# Patient Record
Sex: Female | Born: 1970 | Race: White | Hispanic: No | Marital: Married | Smoking: Never smoker
Health system: Southern US, Community
[De-identification: ages and names within clinical notes are randomized; demographics above are authoritative.]

## PROBLEM LIST (undated history)

## (undated) DIAGNOSIS — N840 Polyp of corpus uteri: Secondary | ICD-10-CM

## (undated) DIAGNOSIS — F329 Major depressive disorder, single episode, unspecified: Secondary | ICD-10-CM

## (undated) DIAGNOSIS — F32A Depression, unspecified: Secondary | ICD-10-CM

## (undated) DIAGNOSIS — N3944 Nocturnal enuresis: Secondary | ICD-10-CM

## (undated) HISTORY — DX: Depression, unspecified: F32.A

## (undated) HISTORY — PX: NO PAST SURGERIES: SHX2092

## (undated) HISTORY — DX: Nocturnal enuresis: N39.44

## (undated) HISTORY — PX: COLONOSCOPY: SHX174

## (undated) HISTORY — DX: Major depressive disorder, single episode, unspecified: F32.9

---

## 2010-03-25 ENCOUNTER — Ambulatory Visit: Payer: Self-pay | Admitting: Family Medicine

## 2010-03-25 DIAGNOSIS — F341 Dysthymic disorder: Secondary | ICD-10-CM

## 2010-03-25 DIAGNOSIS — R05 Cough: Secondary | ICD-10-CM | POA: Insufficient documentation

## 2010-05-21 NOTE — Assessment & Plan Note (Addendum)
Summary: NOV:GEt estab   Vital Signs:  Patient profile:   40 year old female Height:      66 inches Weight:      124 pounds Pulse rate:   88 / minute BP sitting:   113 / 70  (right arm) Cuff size:   regular  Vitals Entered By: Avon Gully CMA, Duncan Dull) (March 25, 2010 3:23 PM) CC: NP-est care   CC:  NP-est care.  History of Present Illness: Cough from her husband. No fever. she has not had any sore throat or ear pain or GI symptoms.  The cough just started and is dry.  She would like me to check her.  She does work in a day care center as well. Her flu and tdap are upto date.   she has been on sertraline for 9 years.  She is currently on hundred and 50 mg a day.  She does say that it increases urination at night.  And occasionally during the daytime which is difficult since she works at a day care.  She is current with happy with her dentist.  She is not interested in weaning off.  She said she did one time in the past and felt so poorly that she restarted it.  Overall she feels that she sleeps well except for the occasional night.  She does take her medication around 6 o'clock.  Her last mammogram and Pap smear were in 2009.  Habits & Providers  Alcohol-Tobacco-Diet     Tobacco Status: never  Exercise-Depression-Behavior     Does Patient Exercise: yes     Drug Use: no  Current Medications (verified): 1)  Zoloft 100 Mg Tabs (Sertraline Hcl)  Allergies (verified): No Known Drug Allergies  Comments:  Nurse/Medical Assistant: The patient's medications and allergies were reviewed with the patient and were updated in the Medication and Allergy Lists. Avon Gully CMA, Duncan Dull) (March 25, 2010 3:24 PM)  Family History: mom with HTN, BrCa dx age 46.  Uncle with MI GF with DM  Social History: Married to [place] with no kids. 2 yeas of college. Works at [employer].  Never Smoked Alcohol use-no Drug use-no Regular exercise-yes, yoga and aerobic exercise 3 times per  week two caffeinated drinks per day Smoking Status:  never Drug Use:  no Does Patient Exercise:  yes  Review of Systems       No fever/sweats/weakness, unexplained weight loss/gain.  No vison changes.  No difficulty hearing/ringing in ears, hay fever/allergies.  No chest pain/discomfort, palpitations.  No Br lump/nipple discharge.  + cough/wheeze.  No blood in BM, nausea/vomiting/diarrhea.  + nighttime urination, leaking urine, unusual vaginal bleeding, discharge (penis or vagina).  No muscle/joint pain. + rash, change in mole.  No HA, memory loss.  No anxiety, sleep d/o, + depression.  + easy bruising/bleeding, NO unexplained lumps.   Physical Exam  General:  Well-developed,well-nourished,in no acute distress; alert,appropriate and cooperative throughout examination Head:  Normocephalic and atraumatic without obvious abnormalities. No apparent alopecia or balding. Eyes:  No corneal or conjunctival inflammation noted. EOMI. Perrla.  Ears:  External ear exam shows no significant lesions or deformities.  Otoscopic examination reveals clear canals, tympanic membranes are intact bilaterally without bulging, retraction, inflammation or discharge. Hearing is grossly normal bilaterally. Nose:  External nasal examination shows no deformity or inflammation.  Mouth:  Oral mucosa and oropharynx without lesions or exudates.  Teeth in good repair. Neck:  No deformities, masses, or tenderness noted. Lungs:  Normal respiratory effort,  chest expands symmetrically. Lungs are clear to auscultation, no crackles or wheezes. Heart:  Normal rate and regular rhythm. S1 and S2 normal without gallop, murmur, click, rub or other extra sounds. Skin:  no rashes.   Cervical Nodes:  Mildly enlarged right ant cerv LN.  Psych:  Cognition and judgment appear intact. Alert and cooperative with normal attention span and concentration. No apparent delusions, illusions, hallucinations   Impression & Recommendations:  Problem  # 1:  ANXIETY DEPRESSION (ICD-300.4) we discussed that and she has been on it for 9 years that certainly if she is ready we can try to wean his medications.  I explained that if we did that we would do it very slowly.  Also if she feels she is unhappy with the increased urination at night and we could certainly with changing her to another SSRI or SNRI.she would like to think about this.  I also encouraged her to schedule a physical in the next month so we can get her up to date on her Pap smear.  I did give her a note to call and schedule her mammogram.  Problem # 2:  COUGH (ICD-786.2) likely early viral syndrome.  She's to college until she is getting worse or develops a fever.  She can use symptomatic treatment.  Complete Medication List: 1)  Zoloft 100 Mg Tabs (Sertraline hcl) .Marland Kitchen.. 1 and 1/2 by mouth daily  Patient Instructions: 1)  Call [phone] to schedule your mammogram. Ask for he [facility] location. They do them downstairs in our building 2)  Schedule a physical with pap anytime. If you come in fasting we can do labwork the same day.     Orders Added: 1)  New Patient Level III [99203]   Immunization History:  Tetanus/Td Immunization History:    Tetanus/Td:  historical (02/21/2008)  Influenza Immunization History:    Influenza:  historical (02/19/2010)   Immunization History:  Tetanus/Td Immunization History:    Tetanus/Td:  Historical (02/21/2008)  Influenza Immunization History:    Influenza:  Historical (02/19/2010)   Immunization History:  Tetanus/Td Immunization History:    Tetanus/Td:  historical (02/21/2008)  Influenza Immunization History:    Influenza:  historical (02/19/2010)

## 2010-05-23 NOTE — Letter (Signed)
Summary: Records Dated 12-13-07 thru 03-12-10/[facility]  Records Dated 12-13-07 thru 03-12-10/[facility]   Imported By: Lanelle Bal 05/16/2010 09:37:31  _____________________________________________________________________  External Attachment:    Type:   Image     Comment:   External Document

## 2010-06-27 ENCOUNTER — Encounter: Payer: Self-pay | Admitting: Family Medicine

## 2010-06-27 ENCOUNTER — Other Ambulatory Visit: Payer: Self-pay | Admitting: Family Medicine

## 2010-06-27 ENCOUNTER — Other Ambulatory Visit (HOSPITAL_COMMUNITY)
Admission: RE | Admit: 2010-06-27 | Discharge: 2010-06-27 | Disposition: A | Discharge status: Home / Self Care | Payer: 59 | Source: Ambulatory Visit | Attending: Family Medicine | Admitting: Family Medicine

## 2010-06-27 ENCOUNTER — Encounter (INDEPENDENT_AMBULATORY_CARE_PROVIDER_SITE_OTHER): Payer: 59 | Admitting: Family Medicine

## 2010-06-27 DIAGNOSIS — Z1159 Encounter for screening for other viral diseases: Secondary | ICD-10-CM | POA: Insufficient documentation

## 2010-06-27 DIAGNOSIS — F341 Dysthymic disorder: Secondary | ICD-10-CM

## 2010-06-27 DIAGNOSIS — Z01419 Encounter for gynecological examination (general) (routine) without abnormal findings: Secondary | ICD-10-CM

## 2010-06-27 DIAGNOSIS — G4700 Insomnia, unspecified: Secondary | ICD-10-CM | POA: Insufficient documentation

## 2010-07-01 ENCOUNTER — Telehealth: Payer: Self-pay | Admitting: Family Medicine

## 2010-07-02 NOTE — Assessment & Plan Note (Signed)
Summary: CPE, pap   Vital Signs:  Patient profile:   40 year old female Height:      66 inches Weight:      121 pounds Pulse rate:   78 / minute BP sitting:   100 / 57  (right arm) Cuff size:   regular  Vitals Entered By: Avon Gully CMA, Duncan Dull) (June 27, 2010 10:59 AM)  Primary Care Provider:  Nani Gasser MD   History of Present Illness: HEre for CPE. Normal periods. NO abdnormal bleeding.    Has been on zoloft for years since college. Has also had chronic insomnia since then as well. Says often time has episoes of bowel incontinence and per her husband will almost act like a zombie at itime on the medication. She is very fearful of comingoff the med because it does help her sleep. She has not tried any other mood meds in the past.  She does feel she needs somehting for mood though.   Current Medications (verified): 1)  Zoloft 100 Mg Tabs (Sertraline Hcl) .Marland Kitchen.. 1 and 1/2 By Mouth Daily  Allergies (verified): No Known Drug Allergies  Comments:  Nurse/Medical Assistant: The patient's medications and allergies were reviewed with the patient and were updated in the Medication and Allergy Lists. Avon Gully CMA, Duncan Dull) (June 27, 2010 10:59 AM)  Past History:  Past Surgical History: NOne  Family History: mom with HTN, BrCa dx age 29.  Mat Uncle with [employer] with DM  Review of Systems  The patient denies anorexia, fever, weight loss, weight gain, vision loss, decreased hearing, hoarseness, chest pain, syncope, dyspnea on exertion, peripheral edema, prolonged cough, headaches, hemoptysis, abdominal pain, melena, hematochezia, severe indigestion/heartburn, hematuria, incontinence, genital sores, muscle weakness, suspicious skin lesions, transient blindness, difficulty walking, depression, unusual weight change, abnormal bleeding, enlarged lymph nodes, and breast masses.    Physical Exam  General:  Well-developed,well-nourished,in no acute distress;  alert,appropriate and cooperative throughout examination Head:  Normocephalic and atraumatic without obvious abnormalities. No apparent alopecia or balding. Eyes:  No corneal or conjunctival inflammation noted. EOMI. Perrla.  Ears:  External ear exam shows no significant lesions or deformities.  Otoscopic examination reveals clear canals, tympanic membranes are intact bilaterally without bulging, retraction, inflammation or discharge. Hearing is grossly normal bilaterally. Nose:  External nasal examination shows no deformity or inflammation.  Mouth:  Oral mucosa and oropharynx without lesions or exudates.  Teeth in good repair. Neck:  No deformities, masses, or tenderness noted. Chest Wall:  No deformities, masses, or tenderness noted. Breasts:  No mass, nodules, thickening, tenderness, bulging, retraction, inflamation, nipple discharge or skin changes noted.   Lungs:  Normal respiratory effort, chest expands symmetrically. Lungs are clear to auscultation, no crackles or wheezes. Heart:  Normal rate and regular rhythm. S1 and S2 normal without gallop, murmur, click, rub or other extra sounds. Abdomen:  Bowel sounds positive,abdomen soft and non-tender without masses, organomegaly or hernias noted. Genitalia:  Normal introitus for age, no external lesions, no vaginal discharge, mucosa pink and moist, no vaginal or cervical lesions, no vaginal atrophy, no friaility or hemorrhage, normal uterus size and position, no adnexal masses or tenderness Msk:  No deformity or scoliosis noted of thoracic or lumbar spine.   Pulses:  R and L carotid,radial,dorsalis pedis and posterior tibial pulses are full and equal bilaterally Extremities:  No clubbing, cyanosis, edema, or deformity noted with normal full range of motion of all joints.   Neurologic:  No cranial nerve deficits noted. Station  and gait are normal.t. Sensory, motor and coordinative functions appear intact. Skin:  no rashes.   Cervical Nodes:  No  lymphadenopathy noted Psych:  Cognition and judgment appear intact. Alert and cooperative with normal attention span and concentration. No apparent delusions, illusions, hallucinations   Impression & Recommendations:  Problem # 1:  ROUTINE GYNECOLOGICAL EXAMINATION (ICD-V72.31) Exam is normal today. Healhty 40 year old.   Discussed need for labs and mammogram screening Will call wiht the pap smear results.  Orders: T-Mammography, Diagnostic (bilateral) (98119) T-Comprehensive Metabolic Panel [phone]) T-Lipid Profile ([id number])  Problem # 2:  ANXIETY DEPRESSION (ICD-300.4) Cut the sertraline in half for one week, then decrease to 1/4 tab daly for one week.  Once drop to 1/2 tab then start the new fluoxetine as on the bottle. Will given low dose ambien for sleep.   Problem # 3:  INSOMNIA (ICD-780.52) Will given low dose ambien for sleep. Let me know if any incontinence issues at night.  Her updated medication list for this problem includes:    Ambien 5 Mg Tabs (Zolpidem tartrate) .Marland Kitchen... Take 1 tablet by mouth once a day at bedtime as needed insomnia.  Complete Medication List: 1)  Zoloft 100 Mg Tabs (Sertraline hcl) .Marland Kitchen.. 1 and 1/2 by mouth daily 2)  Fluoxetine Hcl 20 Mg Tabs (Fluoxetine hcl) .... 1/2 tab by mouth for one week then increase to whole tab daily 3)  Ambien 5 Mg Tabs (Zolpidem tartrate) .... Take 1 tablet by mouth once a day at bedtime as needed insomnia.  Patient Instructions: 1)  Cut the sertraline in half for one week, then decrease to 1/4 tab daly for one week.  Once drop to 1/2 tab then start the new fluoxetine as on the bottle.  2)  It is important that you exercise reguarly at least 20 minutes 5 times a week. If you develop chest pain, have severe difficulty breathing, or feel very tired, stop exercising immediately and seek medical attention.  3)  Take calcium +vitamin D daily.  4)  We will call you with the lab and pap smear results 5)  Please schedule a  follow-up appointment in 1 month.  Prescriptions: AMBIEN 5 MG TABS (ZOLPIDEM TARTRATE) Take 1 tablet by mouth once a day at bedtime as needed insomnia.  #30 x 0   Entered and Authorized by:   Nani Gasser MD   Signed by:   Nani Gasser MD on 06/27/2010   Method used:   Printed then faxed to ...       [facility]  [place] [phone]       [address] [place], [place]  [address]       Ph: [phone] or [phone]       Fax: [phone]   RxID:   [id number] FLUOXETINE HCL 20 MG TABS (FLUOXETINE HCL) 1/2 tab by mouth for one week then increase to whole tab daily  #30 x 0   Entered and Authorized by:   Nani Gasser MD   Signed by:   Nani Gasser MD on 06/27/2010   Method used:   [facility]ronically to        [facility] [phone]* (retail)       [address] [place], [place]  [address]       Ph: [phone] or [phone]       Fax: [phone]   RxID:   [id number]  Orders Added: 1)  T-Mammography, Diagnostic (bilateral) [77056] 2)  T-Comprehensive Metabolic Panel [[id number]] 3)  T-Lipid Profile [[id number]] 4)  Est. Patient age 56-64 [29] 5)  Est. Patient Level II [44010]

## 2010-07-03 ENCOUNTER — Other Ambulatory Visit: Payer: Self-pay | Admitting: Family Medicine

## 2010-07-03 DIAGNOSIS — Z1231 Encounter for screening mammogram for malignant neoplasm of breast: Secondary | ICD-10-CM

## 2010-07-04 LAB — CYTOLOGY - PAP: Pap Smear: NORMAL

## 2010-07-09 NOTE — Progress Notes (Signed)
Summary: Tapering meds  Phone Note Call from Patient Call back at Home Phone [phone] Call back at [phone] work   Caller: Patient Reason for Call: Talk to Nurse Summary of Call: Pt has question about tapering Sertraline, pls call Initial call taken by: Lannette Donath,  July 01, 2010 12:35 PM  Follow-up for Phone Call        Pt called again at 4:37 pm on 3/12, RC to pt.  Message left on VM to return call. Francee Piccolo CMA Duncan Dull)  July 02, 2010 11:20 AM   Additional Follow-up for Phone Call Additional follow up Details #1::        called pt no answer; called alt  num and no answer and no vn has been set up Additional Follow-up by: Avon Gully CMA, Duncan Dull),  July 02, 2010 3:01 PM    Additional Follow-up for Phone Call Additional follow up Details #2::    Pt returned call.  Pt is taking 1 and 1/2 tabs (150 mg) of Zoloft daily.  Directions given were to cut tab in half then in to 1/4.  Given pt is taking 1.5 tabs how do you want her to decrease?  Please advise. Francee Piccolo CMA Duncan Dull)  July 02, 2010 4:56 PM   Ok. dec to one tab daily for one week, then half a tab daily for one week, then every other day for one week. When get to the half tab start the fluoextine at the same time.  Follow-up by: Nani Gasser MD,  July 02, 2010 5:02 PM  Additional Follow-up for Phone Call Additional follow up Details #3:: Details for Additional Follow-up Action Taken: LM to [facility] at home number Francee Piccolo CMA Duncan Dull)  July 03, 2010 3:55 PM   RC from pt.  Above instructions given.  Pt was able to recite instructions to me.  Pt thinks she has enough zoloft to last through weaning.  If not she will call our office.  Advised pt if she has any questions while weaning to call our office and we will go over results with her.  Seh is agreeable.  Additional Follow-up by: Francee Piccolo CMA Duncan Dull),  July 03, 2010 4:55 PM   Appended Document: Tapering  meds Prescriptions: ZOLOFT 100 MG TABS (SERTRALINE HCL) 1 and 1/2 by mouth daily  #45 x 1   Entered and Authorized by:   Nani Gasser MD   Signed by:   Nani Gasser MD on 07/04/2010   Method used:   Electronically to        [facility] [phone]* (retail)       [address] [place], [place]  [address]       Ph: [phone] or [phone]       Fax: [phone]   RxID:   [id number]  Pt calls today with concerns regarding switching meds.  Pt states she has a demanding job and is afraid to change meds at this time.  Pt does sound anxious on the phone while discussing the change.  Pt would prefer to stay on Zoloft at this time and will consider changing at a later date-she could not state when she may be ready to switch.  Dr. Linford Arnold, do you want the patient to come in to discuss, or follow up in a couple of months?  Pt will need refills of Z[facility]ft [facility] as she is almost out.  Please advise. Francee Piccolo CMA (AAMA)  July 04, 2010 2:17 PM

## 2010-07-11 ENCOUNTER — Telehealth: Payer: Self-pay | Admitting: Family Medicine

## 2010-07-11 NOTE — Telephone Encounter (Signed)
Called and left a message for pt to call pharm and have them send [place] a refill request

## 2010-07-11 NOTE — Telephone Encounter (Signed)
Patient called on 07/10/10 and left a message stating she needs a med refill and I can not understand what med it was.. Her call back number is [phone].. Thanks, Michaelle Copas

## 2010-07-12 ENCOUNTER — Other Ambulatory Visit: Payer: Self-pay | Admitting: Family Medicine

## 2010-07-12 LAB — COMPREHENSIVE METABOLIC PANEL
ALT: 13 U/L (ref 0–35)
AST: 17 U/L (ref 0–37)
Calcium: 9.5 mg/dL (ref 8.4–10.5)
Chloride: 103 mEq/L (ref 96–112)
Creat: 0.77 mg/dL (ref 0.40–1.20)

## 2010-07-12 LAB — LIPID PANEL
Total CHOL/HDL Ratio: 2.5 Ratio
VLDL: 10 mg/dL (ref 0–40)

## 2010-07-16 ENCOUNTER — Ambulatory Visit
Admission: RE | Admit: 2010-07-16 | Discharge: 2010-07-16 | Disposition: A | Discharge status: Home / Self Care | Payer: 59 | Source: Ambulatory Visit | Attending: Family Medicine | Admitting: Family Medicine

## 2010-07-16 DIAGNOSIS — Z1231 Encounter for screening mammogram for malignant neoplasm of breast: Secondary | ICD-10-CM

## 2010-07-16 NOTE — Progress Notes (Signed)
Pt notified via vm

## 2010-07-23 ENCOUNTER — Telehealth: Payer: Self-pay | Admitting: *Deleted

## 2010-07-23 NOTE — Telephone Encounter (Signed)
Message copied by Avon Gully on Tue Jul 23, 2010  2:56 PM ------      Message from: Nani Gasser      Created: Thu Jul 18, 2010  4:12 PM       Call pt: Normal mammogram

## 2010-07-23 NOTE — Telephone Encounter (Signed)
LMOM  With results

## 2010-09-03 ENCOUNTER — Encounter: Payer: Self-pay | Admitting: Family Medicine

## 2010-09-09 ENCOUNTER — Ambulatory Visit: Payer: 59 | Admitting: Family Medicine

## 2010-09-12 ENCOUNTER — Ambulatory Visit (INDEPENDENT_AMBULATORY_CARE_PROVIDER_SITE_OTHER): Payer: BC Managed Care – PPO | Admitting: Family Medicine

## 2010-09-12 ENCOUNTER — Encounter: Payer: Self-pay | Admitting: Family Medicine

## 2010-09-12 VITALS — BP 96/62 | HR 68 | Wt 121.0 lb

## 2010-09-12 DIAGNOSIS — N3944 Nocturnal enuresis: Secondary | ICD-10-CM | POA: Insufficient documentation

## 2010-09-12 DIAGNOSIS — F341 Dysthymic disorder: Secondary | ICD-10-CM

## 2010-09-12 HISTORY — DX: Nocturnal enuresis: N39.44

## 2010-09-12 NOTE — Patient Instructions (Signed)
We will call you with the referral. 

## 2010-09-12 NOTE — Assessment & Plan Note (Signed)
This has been going on for 3 years. Her I would like her to actually see urology again to make sure that there is nothing anatomical that could be causing this. It is possible she is sleeping so deeply that she urinates on herself back on this very unusual for a 40 year old female. I would like the urologist also see if they feel that the sertraline could be contributing. This is a very unusual side effect have not seen this happen before. If it is then I do think we should try to change her medication. If there is another cause for this and I would rather leave the sertraline as it does seem to be treating her depression appropriately.

## 2010-09-12 NOTE — Progress Notes (Signed)
  Subjective:    Patient ID: Joann Haynes, female    DOB: 1970/05/15, 40 y.o.   MRN: [id number]  HPI She is on Sertaline for > 10 years for her depression and her sleep.  She really loses bladder control on it at night for about 3 years.  Usually happens about 4 nights a week. She will urinate on her self at night.  Did see a urologist 8 years ago for urinary frequency and was told everything normal.  She doesn't take any sleep aids.  She feels like she is in such a deep sleep that she doesn't wake up.  Her husband snores and she is able to sleep through this. Otherwise the she does feel her mood is very well controlled. She works at a daycare and is also fearful of changing her medication because she doesn't want to affect how she treats the children. When I asked her what one thing is for her back from changing to a different medication she said that she will sleep well. She has tried some sleep aids in the past.   Review of Systems     Objective:   Physical Exam  Constitutional: She is oriented to person, place, and time. She appears well-developed and well-nourished.  HENT:  Head: Normocephalic and atraumatic.  Cardiovascular: Normal rate, regular rhythm and normal heart sounds.   Pulmonary/Chest: Effort normal and breath sounds normal.  Neurological: She is alert and oriented to person, place, and time.  Skin: Skin is warm and dry.  Psychiatric: She has a normal mood and affect. Her behavior is normal. Judgment and thought content normal.          Assessment & Plan:

## 2010-09-12 NOTE — Assessment & Plan Note (Signed)
Very well controlled on sertraline. Her PHQ 9 score today is 4 which is at goal. I am still concerned about the bedwetting and we will refer her to urology for further evaluation.

## 2010-10-11 ENCOUNTER — Telehealth: Payer: Self-pay | Admitting: Family Medicine

## 2010-10-11 NOTE — Telephone Encounter (Signed)
Pt called and said needs med.  Pharm told it was denied.     Plan:  [facility] for the pt and told to call to speak with the triage nurse to discuss which medications she is referring to. Jarvis Newcomer, LPN Domingo Dimes

## 2010-10-14 ENCOUNTER — Telehealth: Payer: Self-pay | Admitting: Family Medicine

## 2010-10-14 MED ORDER — SERTRALINE HCL 100 MG PO TABS: 100.0000 mg | ORAL_TABLET | Freq: Every day | ORAL | Status: DC

## 2010-10-14 NOTE — Telephone Encounter (Signed)
Pt returned call from last week.  Actually, pt had called in last week and triage nurse had left mess with the pt that her call was returned and to call back at her convenience. Plan:  Pt questioning if she is suppose to be on ambien at night.  Told pt med seen on med list, but not mentioned specifically in the note.  Wants to know why she if following up on next Monday.  Said she has not been taking the [place] because she rally doesn't need because she sleeps well.  Told pt I would send a mess to Dr. Linford Arnold so she'll get when she returns on Monday 10-20-10.  Told pt to also call Monday morning early before her appt in the PM so we can determine if appt needed and answer her questions once we talk with Dr. Linford Arnold. Jarvis Newcomer, LPN Domingo Dimes

## 2010-10-14 NOTE — Telephone Encounter (Signed)
Sertraline 100mg  PO (1  1/2) daily , #45/2 refills [facility]. Jarvis Newcomer, LPN Domingo Dimes

## 2010-10-21 ENCOUNTER — Ambulatory Visit (INDEPENDENT_AMBULATORY_CARE_PROVIDER_SITE_OTHER): Payer: BC Managed Care – PPO | Admitting: Family Medicine

## 2010-10-21 ENCOUNTER — Encounter: Payer: Self-pay | Admitting: Family Medicine

## 2010-10-21 VITALS — BP 111/76 | HR 81 | Ht 66.0 in | Wt 117.0 lb

## 2010-10-21 DIAGNOSIS — F329 Major depressive disorder, single episode, unspecified: Secondary | ICD-10-CM

## 2010-10-21 DIAGNOSIS — F341 Dysthymic disorder: Secondary | ICD-10-CM

## 2010-10-21 NOTE — Progress Notes (Signed)
  Subjective:    Patient ID: Joann Haynes, female    DOB: [birth year], 40 y.o.   MRN: [id number]  HPI  Feels not appreciated at work. Really stressed out.  Is trying to find another job.  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 Tearful.  Still taking her sertraline regularly.  Has done counseling in the past. 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 Sleeps well on the sertraline.  Has Urology appt in July with Dr. Beverely Pace.   Review of Systems     Objective:   Physical Exam  Constitutional: She is oriented to person, place, and time. She appears well-developed and well-nourished.  Neurological: She is alert and oriented to person, place, and time.  Skin: Skin is warm and dry.  Psychiatric: She has a normal mood and affect. Her behavior is normal.          Assessment & Plan:  20 min spent face to face in counseling.

## 2010-10-21 NOTE — Assessment & Plan Note (Addendum)
GAD-7 score of 5, PHQ-9 score of 4. Discussed options. I really think she would benefit from cousneling. Will refer since she is agreeable. Has done this in the past and it was helpful.  Will leave med dose where it is.  Has f/u with Urology in the next couple of weeks. FU in 2 mo.

## 2010-11-04 ENCOUNTER — Ambulatory Visit (HOSPITAL_COMMUNITY): Payer: BC Managed Care – PPO | Admitting: Psychology

## 2010-11-20 ENCOUNTER — Ambulatory Visit (HOSPITAL_COMMUNITY): Payer: BC Managed Care – PPO | Admitting: Psychology

## 2010-11-26 ENCOUNTER — Encounter: Payer: Self-pay | Admitting: Family Medicine

## 2010-11-26 ENCOUNTER — Ambulatory Visit (INDEPENDENT_AMBULATORY_CARE_PROVIDER_SITE_OTHER): Payer: BC Managed Care – PPO | Admitting: Family Medicine

## 2010-11-26 DIAGNOSIS — S139XXA Sprain of joints and ligaments of unspecified parts of neck, initial encounter: Secondary | ICD-10-CM

## 2010-11-26 DIAGNOSIS — S134XXA Sprain of ligaments of cervical spine, initial encounter: Secondary | ICD-10-CM

## 2010-11-26 MED ORDER — IBUPROFEN 600 MG PO TABS: 600.0000 mg | ORAL_TABLET | Freq: Four times a day (QID) | ORAL | Status: AC | PRN

## 2010-11-26 MED ORDER — METHOCARBAMOL 750 MG PO TABS: 750.0000 mg | ORAL_TABLET | Freq: Every evening | ORAL | Status: AC | PRN

## 2010-11-26 NOTE — Patient Instructions (Signed)
Use Ibuprofen RX 3-4 x day with food for the next week.  This will help with pain and inflammation. Use Robaxin at night for muscle spasm. Gentle neck ROM exercises during the day and a heating pad will help. Add chiropractic manipulation if you wish.  Work restrictions x 7 days. Call if not much improved after 10 days.
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
## 2010-12-20 ENCOUNTER — Telehealth: Payer: Self-pay | Admitting: Family Medicine

## 2010-12-24 ENCOUNTER — Ambulatory Visit: Payer: BC Managed Care – PPO | Admitting: Family Medicine

## 2011-01-06 ENCOUNTER — Other Ambulatory Visit: Payer: Self-pay | Admitting: *Deleted

## 2011-01-06 MED ORDER — SERTRALINE HCL 100 MG PO TABS: 100.0000 mg | ORAL_TABLET | Freq: Every day | ORAL | Status: DC

## 2011-02-10 ENCOUNTER — Encounter: Payer: Self-pay | Admitting: Family Medicine

## 2011-02-10 ENCOUNTER — Ambulatory Visit (INDEPENDENT_AMBULATORY_CARE_PROVIDER_SITE_OTHER): Payer: 59 | Admitting: Family Medicine

## 2011-02-10 VITALS — BP 94/63 | HR 77 | Wt 120.0 lb

## 2011-02-10 DIAGNOSIS — Z23 Encounter for immunization: Secondary | ICD-10-CM

## 2011-02-10 DIAGNOSIS — F419 Anxiety disorder, unspecified: Secondary | ICD-10-CM

## 2011-02-10 DIAGNOSIS — F411 Generalized anxiety disorder: Secondary | ICD-10-CM

## 2011-02-10 MED ORDER — SERTRALINE HCL 50 MG PO TABS: 50.0000 mg | ORAL_TABLET | Freq: Every day | ORAL | Status: DC

## 2011-02-10 MED ORDER — FLUOXETINE HCL 20 MG PO TABS: 20.0000 mg | ORAL_TABLET | Freq: Every day | ORAL | Status: DC

## 2011-02-10 NOTE — Progress Notes (Signed)
  Subjective:    Patient ID: Joann Haynes, female    DOB: [birth year], 40 y.o.   MRN: [id number]  HPI She is ready to try to change medications. She has bedwetting at night. She did see urology and felt like everything is normal so could be the medication.  She o/w does well on the sertraline. She has been on it for years nd is nervous about coming off. Work has been a little better.     Review of Systems     Objective:   Physical Exam  Constitutional: She appears well-developed and well-nourished.  Cardiovascular: Normal rate, regular rhythm and normal heart sounds.   Skin: Skin is warm and dry.  Psychiatric: She has a normal mood and affect. Her behavior is normal.          Assessment & Plan:  Depression :Doing well on sertraline but causing possible bedwetting.  PHQ-9  Score of 4.  GAD- 7 score of 2. Wrote out a wean for her and will transition to fluoxetine. May feel more jittery adn nervous during the wean. F/U in 6 mo.  Call if any concerns.

## 2011-02-10 NOTE — Patient Instructions (Signed)
Decrease setraline to 50mg  ( 2 tabs a day) for 7 days, then decrease to one tab for 7 days, then half a tab for 7 days, and then one-half every other day for 8 days. When drop to a half a tab can start the new medication at the same time. Start 1/2 tab of the fluoxetine for one week and then increase to whole tab daily until I see you back.

## 2011-03-03 ENCOUNTER — Telehealth: Payer: Self-pay | Admitting: Family Medicine

## 2011-03-03 NOTE — Telephone Encounter (Signed)
Patient called left a voice messg for a nurse to call her back. Her cell [phone] or wrk # [id number]

## 2011-03-21 ENCOUNTER — Encounter: Payer: Self-pay | Admitting: Family Medicine

## 2011-03-28 ENCOUNTER — Encounter: Payer: Self-pay | Admitting: Family Medicine

## 2011-03-28 ENCOUNTER — Ambulatory Visit (INDEPENDENT_AMBULATORY_CARE_PROVIDER_SITE_OTHER): Payer: 59 | Admitting: Family Medicine

## 2011-03-28 VITALS — BP 102/70 | HR 68 | Wt 123.0 lb

## 2011-03-28 DIAGNOSIS — F3289 Other specified depressive episodes: Secondary | ICD-10-CM

## 2011-03-28 DIAGNOSIS — F329 Major depressive disorder, single episode, unspecified: Secondary | ICD-10-CM

## 2011-03-28 NOTE — Progress Notes (Signed)
  Subjective:    Patient ID: Joann Haynes, female    DOB: 11/06/1970, 40 y.o.   MRN: [id number]  HPI  She did wean off the sertraline and has been solely on the prozac for 4 days dong well. HAd a few bad days. Still had a bedwetting acccident 2 nights ago.    Review of Systems     Objective:   Physical Exam  Constitutional: She is oriented to person, place, and time. She appears well-developed and well-nourished.  HENT:  Head: Normocephalic and atraumatic.  Neurological: She is alert and oriented to person, place, and time.  Skin: Skin is warm and dry.  Psychiatric: She has a normal mood and affect. Her behavior is normal.          Assessment & Plan:  Anxiety/Depression - PHQ-9 score of 6. Will continue current dose and f/u in 6 weeks. If wants to inc her dose after 2 weeks can call the office.  She is tolerating well.

## 2011-04-18 ENCOUNTER — Other Ambulatory Visit: Payer: Self-pay | Admitting: Family Medicine

## 2011-05-05 ENCOUNTER — Telehealth: Payer: Self-pay | Admitting: *Deleted

## 2011-05-05 MED ORDER — FLUOXETINE HCL 40 MG PO CAPS: 40.0000 mg | ORAL_CAPSULE | Freq: Every day | ORAL | Status: DC

## 2011-05-05 NOTE — Telephone Encounter (Signed)
Pt wants to increase dose of the FLuoxetine to take two daily

## 2011-05-05 NOTE — Telephone Encounter (Signed)
Ok to take 2 a day. Will send over new rx for 40mg .

## 2011-05-06 NOTE — Telephone Encounter (Signed)
LM for pt

## 2011-05-13 ENCOUNTER — Ambulatory Visit (INDEPENDENT_AMBULATORY_CARE_PROVIDER_SITE_OTHER): Payer: 59 | Admitting: Family Medicine

## 2011-05-13 ENCOUNTER — Encounter: Payer: Self-pay | Admitting: Family Medicine

## 2011-05-13 VITALS — BP 110/68 | HR 76 | Temp 98.6°F | Wt 121.0 lb

## 2011-05-13 DIAGNOSIS — F411 Generalized anxiety disorder: Secondary | ICD-10-CM

## 2011-05-13 DIAGNOSIS — F419 Anxiety disorder, unspecified: Secondary | ICD-10-CM

## 2011-05-13 DIAGNOSIS — F329 Major depressive disorder, single episode, unspecified: Secondary | ICD-10-CM

## 2011-05-13 NOTE — Progress Notes (Signed)
  Subjective:    Patient ID: Joann Haynes, female    DOB: 1970/09/04, 41 y.o.   MRN: [id number]  HPI F/U depression/ anxiety. Says the nighttime urination is much better. Still sleeping well overall but not as well as with the old medication.  No SE on the fluoexetine. We inc her dose to 40mg  about 2 weeks ago.  Less irritabilityon that dose.    Review of Systems     Objective:   Physical Exam  Constitutional: She is oriented to person, place, and time. She appears well-developed and well-nourished.  HENT:  Head: Normocephalic and atraumatic.  Cardiovascular: Normal rate, regular rhythm and normal heart sounds.   Pulmonary/Chest: Effort normal and breath sounds normal.  Neurological: She is alert and oriented to person, place, and time.  Skin: Skin is warm and dry.  Psychiatric: She has a normal mood and affect. Her behavior is normal.          Assessment & Plan:  Depression/Anxiety - PHQ-9 of 6.  GAD-7 score of 3.  Overall she's doing well. I think this is been a much better medication for her. I advised that he did when he has improved significantly. I will see her back in 6 weeks. Let's see her PHQ nonspore less than 4. At that point she is doing really well then I will see her in 3-4 months after that. I did send her refills when we sent her prescription earlier this month.

## 2011-07-02 ENCOUNTER — Encounter: Payer: Self-pay | Admitting: *Deleted

## 2011-07-03 ENCOUNTER — Ambulatory Visit: Payer: 59 | Admitting: Family Medicine

## 2011-07-11 ENCOUNTER — Ambulatory Visit: Payer: 59 | Admitting: Family Medicine

## 2011-07-15 ENCOUNTER — Ambulatory Visit (INDEPENDENT_AMBULATORY_CARE_PROVIDER_SITE_OTHER): Payer: 59 | Admitting: Family Medicine

## 2011-07-15 ENCOUNTER — Encounter: Payer: Self-pay | Admitting: Family Medicine

## 2011-07-15 VITALS — BP 93/55 | HR 155 | Ht 66.0 in | Wt 124.0 lb

## 2011-07-15 DIAGNOSIS — F419 Anxiety disorder, unspecified: Secondary | ICD-10-CM

## 2011-07-15 DIAGNOSIS — F341 Dysthymic disorder: Secondary | ICD-10-CM

## 2011-07-15 NOTE — Progress Notes (Signed)
  Subjective:    Patient ID: Joann Haynes, female    DOB: September 27, 1970, 41 y.o.   MRN: [id number]  HPI Says the bedwetting is better and the 40mg  dose is working well.  Sleeping well overall but not as well as when on the citalopram.  She still has a fair amount of stress at work.  No other SE on the medication.    Review of Systems     Objective:   Physical Exam  Constitutional: She is oriented to person, place, and time. She appears well-developed and well-nourished.  HENT:  Head: Normocephalic and atraumatic.  Cardiovascular: Normal rate, regular rhythm and normal heart sounds.   Pulmonary/Chest: Effort normal and breath sounds normal.  Neurological: She is alert and oriented to person, place, and time.  Skin: Skin is warm and dry.  Psychiatric: She has a normal mood and affect. Her behavior is normal.          Assessment & Plan:  ANxiety/Depression :  GAD-7 score of 4, PHQ score of 6.  Almost at goal.  F/U in 4 months. Call if any problems. She is doing well.

## 2011-09-10 ENCOUNTER — Other Ambulatory Visit: Payer: Self-pay | Admitting: Family Medicine

## 2012-01-08 ENCOUNTER — Other Ambulatory Visit: Payer: Self-pay | Admitting: Family Medicine

## 2012-05-12 ENCOUNTER — Other Ambulatory Visit: Payer: Self-pay | Admitting: Family Medicine

## 2012-06-13 ENCOUNTER — Other Ambulatory Visit: Payer: Self-pay | Admitting: Family Medicine

## 2012-06-15 ENCOUNTER — Other Ambulatory Visit: Payer: Self-pay | Admitting: *Deleted

## 2012-06-15 MED ORDER — FLUOXETINE HCL 40 MG PO CAPS: 40.0000 mg | ORAL_CAPSULE | Freq: Every day | ORAL | Status: DC

## 2012-06-21 ENCOUNTER — Encounter: Payer: Self-pay | Admitting: Family Medicine

## 2012-06-21 ENCOUNTER — Ambulatory Visit (INDEPENDENT_AMBULATORY_CARE_PROVIDER_SITE_OTHER): Payer: 59 | Admitting: Family Medicine

## 2012-06-21 VITALS — BP 104/66 | HR 75 | Ht 66.6 in | Wt 118.0 lb

## 2012-06-21 DIAGNOSIS — F341 Dysthymic disorder: Secondary | ICD-10-CM

## 2012-06-21 MED ORDER — FLUOXETINE HCL 40 MG PO CAPS: 40.0000 mg | ORAL_CAPSULE | Freq: Every day | ORAL | Status: DC

## 2012-06-21 NOTE — Progress Notes (Signed)
  Subjective:    Patient ID: Joann Haynes, female    DOB: 01/12/1971, 42 y.o.   MRN: [id number]  HPI Here to followup on anxiety and depression. Currently taking fluoxetine 40 mg. Still really has a stressful job.  Sleeping well.  Still occ having urinary accidents but usually doesn't want toi get out of bed. No anxiety right now. Trying to decide if should change careers.    Review of Systems     Objective:   Physical Exam  Constitutional: She is oriented to person, place, and time. She appears well-developed and well-nourished.  HENT:  Head: Normocephalic and atraumatic.  Cardiovascular: Normal rate, regular rhythm and normal heart sounds.   Pulmonary/Chest: Effort normal and breath sounds normal.  Neurological: She is alert and oriented to person, place, and time.  Skin: Skin is warm and dry.  Psychiatric: She has a normal mood and affect. Her behavior is normal.          Assessment & Plan:  Anxiety/depression-PHQ 9 score of 3. Gad 7 score 0. Well controlled.  Feels good about the medication.  Not wanting to wean down on the medication.   Due for mammo. Will schedule.   Tim spent 20 min, > 50% spent in cousneling.

## 2012-07-02 ENCOUNTER — Encounter: Payer: Self-pay | Admitting: Family Medicine

## 2012-07-02 ENCOUNTER — Ambulatory Visit (INDEPENDENT_AMBULATORY_CARE_PROVIDER_SITE_OTHER): Payer: BC Managed Care – PPO | Admitting: Family Medicine

## 2012-07-02 VITALS — BP 106/64 | HR 75 | Temp 97.8°F | Ht 66.0 in | Wt 122.0 lb

## 2012-07-02 DIAGNOSIS — A084 Viral intestinal infection, unspecified: Secondary | ICD-10-CM

## 2012-07-02 MED ORDER — PROMETHAZINE HCL 25 MG PO TABS: 25.0000 mg | ORAL_TABLET | Freq: Four times a day (QID) | ORAL | Status: DC | PRN

## 2012-07-02 NOTE — Progress Notes (Signed)
  Subjective:    Patient ID: Joann Haynes, female    DOB: 10/31/1970, 42 y.o.   MRN: [id number]  HPI  HA started last night but woke up nauseated this AM. Vomited once.  Husband with nausea, vomiting and diarrhea since 3AM.  Took Aleve for the HA last night.    Works in a daycare and gastroenteritis going around. No fever.  Still nauseated.   Review of Systems     Objective:   Physical Exam  Constitutional: She is oriented to person, place, and time. She appears well-developed and well-nourished.  HENT:  Head: Normocephalic and atraumatic.  Cardiovascular: Normal rate, regular rhythm and normal heart sounds.   Pulmonary/Chest: Effort normal and breath sounds normal.  Abdominal: Soft. Bowel sounds are normal. She exhibits no distension and no mass. There is no tenderness. There is no rebound and no guarding.  Neurological: She is alert and oriented to person, place, and time.  Skin: Skin is warm and dry.  Psychiatric: She has a normal mood and affect. Her behavior is normal.          Assessment & Plan:  Viral gastroenteritis-discussed diagnosis and symptoms. Handout given. Make sure staying well hydrated and avoid food and she's feeling better. It should be limited to about 24 hour time period. If she is still vomiting passed out and she needs to let me know. She's afebrile today. Given a prescription for Phenergan to use when necessary. Avoiding using any antidiarrheals. Work note given for today.

## 2012-07-02 NOTE — Patient Instructions (Signed)
Viral Gastroenteritis Viral gastroenteritis is also called stomach flu. This illness is caused by a certain type of germ (virus). It can cause sudden watery poop (diarrhea) and throwing up (vomiting). This can cause you to lose body fluids (dehydration). This illness usually lasts for 3 to 8 days. It usually goes away on its own. HOME CARE   Drink enough fluids to keep your pee (urine) clear or pale yellow. Drink small amounts of fluids often.  Ask your doctor how to replace body fluid losses (rehydration).  Avoid:  Foods high in sugar.  Alcohol.  Bubbly (carbonated) drinks.  Tobacco.  Juice.  Caffeine drinks.  Very hot or cold fluids.  Fatty, greasy foods.  Eating too much at one time.  Dairy products until 24 to 48 hours after your watery poop stops.  You may eat foods with active cultures (probiotics). They can be found in some yogurts and supplements.  Wash your hands well to avoid spreading the illness.  Only take medicines as told by your doctor. Do not give aspirin to children. Do not take medicines for watery poop (antidiarrheals).  Ask your doctor if you should keep taking your regular medicines.  Keep all doctor visits as told. GET HELP RIGHT AWAY IF:   You cannot keep fluids down.  You do not pee at least once every 6 to 8 hours.  You are short of breath.  You see blood in your poop or throw up. This may look like coffee grounds.  You have belly (abdominal) pain that gets worse or is just in one small spot (localized).  You keep throwing up or having watery poop.  You have a fever.  The patient is a child younger than 3 months, and he or she has a fever.  The patient is a child older than 3 months, and he or she has a fever and problems that do not go away.  The patient is a child older than 3 months, and he or she has a fever and problems that suddenly get worse.  The patient is a baby, and he or she has no tears when crying. MAKE SURE YOU:     Understand these instructions.  Will watch your condition.  Will get help right away if you are not doing well or get worse. Document Released: 09/24/2007 Document Revised: 06/30/2011 Document Reviewed: 01/22/2011 [employer] Patient Information 2013 [employer].  

## 2012-07-12 ENCOUNTER — Ambulatory Visit (HOSPITAL_COMMUNITY): Payer: BC Managed Care – PPO

## 2012-07-12 ENCOUNTER — Ambulatory Visit (INDEPENDENT_AMBULATORY_CARE_PROVIDER_SITE_OTHER): Payer: BC Managed Care – PPO

## 2012-07-12 DIAGNOSIS — Z1231 Encounter for screening mammogram for malignant neoplasm of breast: Secondary | ICD-10-CM

## 2012-10-17 ENCOUNTER — Other Ambulatory Visit: Payer: Self-pay | Admitting: Family Medicine

## 2012-11-16 ENCOUNTER — Ambulatory Visit: Payer: BC Managed Care – PPO | Admitting: Family Medicine

## 2013-02-15 ENCOUNTER — Other Ambulatory Visit: Payer: Self-pay | Admitting: Family Medicine

## 2013-03-25 ENCOUNTER — Other Ambulatory Visit: Payer: Self-pay | Admitting: Family Medicine

## 2013-03-28 ENCOUNTER — Encounter: Payer: Self-pay | Admitting: Family Medicine

## 2013-03-28 ENCOUNTER — Ambulatory Visit (INDEPENDENT_AMBULATORY_CARE_PROVIDER_SITE_OTHER): Payer: BC Managed Care – PPO | Admitting: Family Medicine

## 2013-03-28 VITALS — BP 90/54 | HR 73 | Temp 98.2°F | Ht 65.0 in | Wt 122.0 lb

## 2013-03-28 DIAGNOSIS — F329 Major depressive disorder, single episode, unspecified: Secondary | ICD-10-CM

## 2013-03-28 DIAGNOSIS — F411 Generalized anxiety disorder: Secondary | ICD-10-CM

## 2013-03-28 DIAGNOSIS — F341 Dysthymic disorder: Secondary | ICD-10-CM

## 2013-03-28 DIAGNOSIS — Z23 Encounter for immunization: Secondary | ICD-10-CM

## 2013-03-28 MED ORDER — FLUOXETINE HCL 40 MG PO CAPS: ORAL_CAPSULE | ORAL | Status: DC

## 2013-03-28 NOTE — Progress Notes (Signed)
   Subjective:    Patient ID: Joann Haynes, female    DOB: 05-13-1970, 41 y.o.   MRN: [id number]  HPI Here for followup on depression and anxiety-overall she's doing fantastic. She's happy with her current regimen would like to continue it. She's happy with her current dose. She's still sleeping well. She denies any negative side effects on the medication. She would like refills sent to her pharmacy.   Review of Systems     Objective:   Physical Exam  Constitutional: She is oriented to person, place, and time. She appears well-developed and well-nourished.  HENT:  Head: Normocephalic and atraumatic.  Cardiovascular: Normal rate, regular rhythm and normal heart sounds.   Pulmonary/Chest: Effort normal and breath sounds normal.  Neurological: She is alert and oriented to person, place, and time.  Skin: Skin is warm and dry.  Psychiatric: She has a normal mood and affect. Her behavior is normal.          Assessment & Plan:  Depression and Anxiety - PHQ- 9 score of 3 and GAD- 7 score of 1. Well controlled. Continue current regimen. Followup in 6 months. Refills sent to pharmacy.  Flu vaccine given today.

## 2013-05-10 ENCOUNTER — Ambulatory Visit (INDEPENDENT_AMBULATORY_CARE_PROVIDER_SITE_OTHER): Payer: BC Managed Care – PPO | Admitting: Family Medicine

## 2013-05-10 ENCOUNTER — Encounter: Payer: Self-pay | Admitting: Family Medicine

## 2013-05-10 VITALS — BP 103/63 | HR 69 | Temp 97.6°F | Ht 66.0 in | Wt 119.0 lb

## 2013-05-10 DIAGNOSIS — J069 Acute upper respiratory infection, unspecified: Secondary | ICD-10-CM

## 2013-05-10 DIAGNOSIS — J029 Acute pharyngitis, unspecified: Secondary | ICD-10-CM

## 2013-05-10 LAB — POCT RAPID STREP A (OFFICE): Rapid Strep A Screen: NEGATIVE

## 2013-05-10 NOTE — Addendum Note (Signed)
Addended by: Teddy Spike on: 05/10/2013 12:41 PM   Modules accepted: Orders

## 2013-05-10 NOTE — Progress Notes (Signed)
   Subjective:    Patient ID: Joann Haynes, female    DOB: 21-Feb-1971, 43 y.o.   MRN: [id number]  HPI Cough and ST x 2 days.  Took some OTC cough medication and helped some. She has been fatigued.  No fatigue, sweats or chills.  No HA or stomach upset. Cough is more dry.    Review of Systems     Objective:   Physical Exam  Constitutional: She is oriented to person, place, and time. She appears well-developed and well-nourished.  HENT:  Head: Normocephalic and atraumatic.  Right Ear: External ear normal.  Left Ear: External ear normal.  Nose: Nose normal.  Mouth/Throat: Oropharynx is clear and moist.  TMs and canals are clear.   Eyes: Conjunctivae and EOM are normal. Pupils are equal, round, and reactive to light.  Neck: Neck supple. No thyromegaly present.  Cardiovascular: Normal rate, regular rhythm and normal heart sounds.   Pulmonary/Chest: Effort normal and breath sounds normal. She has no wheezes.  Lymphadenopathy:    She has no cervical adenopathy.  Neurological: She is alert and oriented to person, place, and time.  Skin: Skin is warm and dry.  Psychiatric: She has a normal mood and affect.          Assessment & Plan:  URI - likely viral. Continue symptomatic care.  Rapis strep is neg. and. Work note given for today. Call if not improving or suddenly gets worse.

## 2013-05-10 NOTE — Patient Instructions (Signed)

## 2013-09-01 ENCOUNTER — Other Ambulatory Visit: Payer: Self-pay | Admitting: Family Medicine

## 2013-09-01 DIAGNOSIS — Z1231 Encounter for screening mammogram for malignant neoplasm of breast: Secondary | ICD-10-CM

## 2013-09-02 ENCOUNTER — Encounter: Payer: Self-pay | Admitting: Family Medicine

## 2013-09-02 ENCOUNTER — Ambulatory Visit: Payer: BC Managed Care – PPO | Admitting: Family Medicine

## 2013-09-02 ENCOUNTER — Ambulatory Visit (INDEPENDENT_AMBULATORY_CARE_PROVIDER_SITE_OTHER): Payer: BC Managed Care – PPO | Admitting: Family Medicine

## 2013-09-02 VITALS — BP 111/66 | HR 78 | Wt 117.0 lb

## 2013-09-02 DIAGNOSIS — IMO0002 Reserved for concepts with insufficient information to code with codable children: Secondary | ICD-10-CM

## 2013-09-02 DIAGNOSIS — N979 Female infertility, unspecified: Secondary | ICD-10-CM

## 2013-09-02 NOTE — Progress Notes (Signed)
   Subjective:    Patient ID: Joann Haynes, female    DOB: Dec 16, 1970, 43 y.o.   MRN: [id number]  HPI You're to discuss wanting to get pregnant. She's never had children and is now 49 years old and would like to consider trying to get pregnant. Her husband had children in a previous marriage but was not able to have children with his second wife. He had back surgery years ago and thought that something was injured that affected his ability to get someone pregnant. They have been married for 4 years and has had unprotected sex the entire time. She has never gotten pregnant.   Review of Systems     Objective:   Physical Exam  Constitutional: She is oriented to person, place, and time. She appears well-developed and well-nourished.  Neurological: She is alert and oriented to person, place, and time.  Skin: Skin is warm and dry.  Psychiatric: She has a normal mood and affect. Her behavior is normal.          Assessment & Plan:  Desire for pregnancy-she may have infertility especially with unprotected sex for 4 years without becoming pregnant. At this point in her life if she is really truly desiring pregnancy we need to get her in with an OB/GYN immediately so that she can have further workup and evaluation for infertility. This would also include a sperm test for her husband since he feels like it is partially him that it is part of the infertility issue. A sperm count to be very easily done to determine this or not. We also discussed that she would need to start her prenatal vitamin. We also discussed that there is an increased risk of birth defects etc. as the patient ages. She would be considered a high risk pregnancy since she is over the age of 21. It is also somewhat urgent and she is now in her mid 27s at some point we'll go through menopause.

## 2013-09-06 ENCOUNTER — Telehealth: Payer: Self-pay

## 2013-09-06 NOTE — Telephone Encounter (Signed)
Left message for patient to call office for more information on where she needs to go for infertility.

## 2013-09-07 ENCOUNTER — Telehealth: Payer: Self-pay

## 2013-09-07 NOTE — Telephone Encounter (Signed)
Patient was referred to our office for infertility but patient needs to be seen by a fertility specialist and the info was given to patient to call Dr. Kerin Perna [phone] for appt.

## 2013-09-13 ENCOUNTER — Ambulatory Visit (INDEPENDENT_AMBULATORY_CARE_PROVIDER_SITE_OTHER): Payer: BC Managed Care – PPO

## 2013-09-13 DIAGNOSIS — Z1231 Encounter for screening mammogram for malignant neoplasm of breast: Secondary | ICD-10-CM

## 2014-01-21 ENCOUNTER — Other Ambulatory Visit: Payer: Self-pay | Admitting: Family Medicine

## 2014-02-20 ENCOUNTER — Other Ambulatory Visit: Payer: Self-pay | Admitting: Family Medicine

## 2014-03-09 ENCOUNTER — Ambulatory Visit: Payer: BC Managed Care – PPO | Admitting: Family Medicine

## 2014-03-10 ENCOUNTER — Encounter: Payer: Self-pay | Admitting: Family Medicine

## 2014-03-10 ENCOUNTER — Ambulatory Visit (INDEPENDENT_AMBULATORY_CARE_PROVIDER_SITE_OTHER): Payer: BC Managed Care – PPO | Admitting: Family Medicine

## 2014-03-10 ENCOUNTER — Ambulatory Visit (INDEPENDENT_AMBULATORY_CARE_PROVIDER_SITE_OTHER): Payer: BC Managed Care – PPO | Admitting: *Deleted

## 2014-03-10 VITALS — BP 101/65 | HR 67

## 2014-03-10 DIAGNOSIS — R21 Rash and other nonspecific skin eruption: Secondary | ICD-10-CM

## 2014-03-10 DIAGNOSIS — F418 Other specified anxiety disorders: Secondary | ICD-10-CM

## 2014-03-10 DIAGNOSIS — Z23 Encounter for immunization: Secondary | ICD-10-CM

## 2014-03-10 DIAGNOSIS — F430 Acute stress reaction: Secondary | ICD-10-CM | POA: Diagnosis not present

## 2014-03-10 MED ORDER — FLUOXETINE HCL 40 MG PO CAPS
40.0000 mg | ORAL_CAPSULE | Freq: Every day | ORAL | Status: DC
Start: 2014-03-10 — End: 2014-12-17

## 2014-03-10 NOTE — Progress Notes (Signed)
   Subjective:    Patient ID: Joann Haynes, female    DOB: 11/27/1970, 43 y.o.   MRN: [id number]  HPI Follow-up depression and anxiety-she still complete of some irritability and feeling down and depressed several days of the week. She also feels bad about herself. Denies any thoughts of harming herself. She is currently on fluoxetine 40 mg daily. She is sleeping well.  She is under a lot of stress right now. Work has been stressful. She's also been taking classes at the community college but can't quite decide what she wants to do. 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 She her husband unfortunately RN some financial distress. They recently filed bankruptcy. She has been have very differing views on finances and this is caused some tension. She has done some counseling years ago before she got married.  Itchy rash between the toes for several months.  Has tried OTC medication for athletes foot and helps some but then comes right back.  Review of Systems     Objective:   Physical Exam  Constitutional: She is oriented to person, place, and time. She appears well-developed and well-nourished.  HENT:  Head: Normocephalic and atraumatic.  Right Ear: External ear normal.  Left Ear: External ear normal.  Nose: Nose normal.  Mouth/Throat: Oropharynx is clear and moist.  TMs and canals are clear.   Eyes: Conjunctivae and EOM are normal. Pupils are equal, round, and reactive to light.  Neck: Neck supple. No thyromegaly present.  Cardiovascular: Normal rate, regular rhythm and normal heart sounds.   Pulmonary/Chest: Effort normal and breath sounds normal. She has no wheezes.  Lymphadenopathy:    She has no cervical adenopathy.  Neurological: She is alert and oriented to person, place, and time.  Skin: Skin is warm and dry.  Psychiatric: She  has a normal mood and affect.          Assessment & Plan:  Depression/anxiety-gad 7 score of 1 and PHQ 9 score of 3 today. Overall well controlled. The she is having some incontinence at night. She has been seen by urologist in the past to make sure that anatomically and functionally everything is normal. Unfortunately, she had a similar side effects on a different SSRI. For now she wants to continue her current regimen.  Rash foot. Most consistent with athlete's foot. I suspect she probably hasn't been using antifungal long enough. KOH performed today just to confirm diagnosis.  Acute stress reaction- Refer to therapist/counselor

## 2014-03-12 LAB — KOH PREP: RESULT - KOH: NONE SEEN

## 2014-03-13 ENCOUNTER — Other Ambulatory Visit: Payer: Self-pay | Admitting: Family Medicine

## 2014-03-13 MED ORDER — CLOTRIMAZOLE 1 % EX OINT: 1.0000 "application " | TOPICAL_OINTMENT | Freq: Two times a day (BID) | CUTANEOUS | Status: DC

## 2014-11-14 ENCOUNTER — Other Ambulatory Visit: Payer: Self-pay | Admitting: Family Medicine

## 2014-11-14 DIAGNOSIS — Z1231 Encounter for screening mammogram for malignant neoplasm of breast: Secondary | ICD-10-CM

## 2014-11-16 ENCOUNTER — Ambulatory Visit (INDEPENDENT_AMBULATORY_CARE_PROVIDER_SITE_OTHER): Payer: BLUE CROSS/BLUE SHIELD | Admitting: Family Medicine

## 2014-11-16 ENCOUNTER — Encounter: Payer: Self-pay | Admitting: Family Medicine

## 2014-11-16 VITALS — BP 104/60 | HR 80 | Wt 119.0 lb

## 2014-11-16 DIAGNOSIS — Z23 Encounter for immunization: Secondary | ICD-10-CM

## 2014-11-16 DIAGNOSIS — Z111 Encounter for screening for respiratory tuberculosis: Secondary | ICD-10-CM | POA: Diagnosis not present

## 2014-11-16 DIAGNOSIS — Z0184 Encounter for antibody response examination: Secondary | ICD-10-CM

## 2014-11-16 NOTE — Addendum Note (Signed)
Addended by: Narda Rutherford on: 11/16/2014 04:46 PM   Modules accepted: Orders

## 2014-11-16 NOTE — Patient Instructions (Signed)
You'll need to come to urgent care on Saturday to have her TB skin test checked. He did not need an appointment for this.

## 2014-11-16 NOTE — Progress Notes (Signed)
   Subjective:    Patient ID: Joann Haynes, female    DOB: [birth year], 44 y.o.   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 She has had all her childhood vaccines but she does not have copies of the records. She said she had chickenpox as a child. She has never had the hepatitis B series. Her tetanus vaccine is up-to-date. No recent TB skin test.   Review of Systems     Objective:   Physical Exam  Constitutional: She is oriented to person, place, and time. She appears well-developed and well-nourished.  HENT:  Head: Normocephalic and atraumatic.  Neurological: She is alert and oriented to person, place, and time.  Skin: Skin is warm and dry.  Psychiatric: She has a normal mood and affect. Her behavior is normal.          Assessment & Plan:  Immune status testing-we will do immunity testing for measles, mumps, rubella and varicella. Tdap is up-to-date per our records. We will place TB skin test today and give her her first hepatitis B vaccination. It is not flu season so she will not need this at this time.

## 2014-11-17 LAB — MEASLES/MUMPS/RUBELLA IMMUNITY
MUMPS IGG: 104 [AU]/ml — AB (ref <9.00)
Rubella: 1.5 Index — ABNORMAL HIGH (ref <0.90)
Rubeola IgG: 44.6 AU/mL — ABNORMAL HIGH (ref <25.00)

## 2014-11-17 LAB — VARICELLA ZOSTER ANTIBODY, IGG: VARICELLA IGG: 299.2 {index} — AB (ref <135.00)

## 2014-11-18 ENCOUNTER — Emergency Department
Admission: EM | Admit: 2014-11-18 | Discharge: 2014-11-18 | Disposition: A | Discharge status: Home / Self Care | Payer: Self-pay | Source: Home / Self Care

## 2014-11-18 LAB — TB SKIN TEST
INDURATION: 0 mm
TB SKIN TEST: NEGATIVE

## 2014-11-18 NOTE — ED Notes (Signed)
Pt here for PPD read. She had this placed in primary care on 7/28. There is no documentation on which arm the tb test was placed. Both arms negative and no induration. She will return Monday to primary care to receive her paperwork.

## 2014-12-14 ENCOUNTER — Ambulatory Visit (INDEPENDENT_AMBULATORY_CARE_PROVIDER_SITE_OTHER): Payer: BLUE CROSS/BLUE SHIELD | Admitting: Sports Medicine

## 2014-12-14 VITALS — BP 101/65 | HR 74 | Temp 98.7°F | Wt 116.0 lb

## 2014-12-14 DIAGNOSIS — Z23 Encounter for immunization: Secondary | ICD-10-CM | POA: Diagnosis not present

## 2014-12-14 DIAGNOSIS — N979 Female infertility, unspecified: Secondary | ICD-10-CM

## 2014-12-14 NOTE — Progress Notes (Signed)
Patient came into clinic today for her second hep B immunization. Pt is going through the CNA program and [employer] so theses are required. Pt tolerated injection in left deltoid well, with no immediate complications. Pt also mentioned while in office a referral that was placed to OB/GYN regarding infertility. Checked this in her chart and the referral has expired. New referral placed as Pt states her and her husband are ready to begin the process of conception. No further questions/concerns at this time.

## 2014-12-17 ENCOUNTER — Other Ambulatory Visit: Payer: Self-pay | Admitting: Family Medicine

## 2014-12-19 ENCOUNTER — Telehealth: Payer: Self-pay

## 2014-12-19 NOTE — Telephone Encounter (Signed)
Left message received referral from primary care. Call to schedule appointment.

## 2014-12-20 ENCOUNTER — Ambulatory Visit: Payer: Self-pay

## 2014-12-26 ENCOUNTER — Ambulatory Visit: Payer: Self-pay

## 2014-12-26 ENCOUNTER — Ambulatory Visit (INDEPENDENT_AMBULATORY_CARE_PROVIDER_SITE_OTHER): Payer: BLUE CROSS/BLUE SHIELD | Admitting: Family Medicine

## 2014-12-26 ENCOUNTER — Ambulatory Visit (INDEPENDENT_AMBULATORY_CARE_PROVIDER_SITE_OTHER): Payer: BLUE CROSS/BLUE SHIELD

## 2014-12-26 VITALS — Temp 98.5°F

## 2014-12-26 DIAGNOSIS — Z111 Encounter for screening for respiratory tuberculosis: Secondary | ICD-10-CM

## 2014-12-26 DIAGNOSIS — R928 Other abnormal and inconclusive findings on diagnostic imaging of breast: Secondary | ICD-10-CM | POA: Diagnosis not present

## 2014-12-26 DIAGNOSIS — Z1231 Encounter for screening mammogram for malignant neoplasm of breast: Secondary | ICD-10-CM

## 2014-12-26 NOTE — Progress Notes (Signed)
   Subjective:    Patient ID: Joann Haynes, female    DOB: 01-02-1971, 44 y.o.   MRN: [id number]  HPI  [name] is here for a PPD placement.    Review of Systems     Objective:   Physical Exam        Assessment & Plan:  Patient tolerated injection well without complications. Patient advised to return after 11:15 on Thursday or before 11:15 on Friday.

## 2014-12-28 ENCOUNTER — Other Ambulatory Visit: Payer: Self-pay | Admitting: Family Medicine

## 2014-12-28 DIAGNOSIS — R928 Other abnormal and inconclusive findings on diagnostic imaging of breast: Secondary | ICD-10-CM

## 2014-12-29 LAB — TB SKIN TEST
INDURATION: 0 mm
TB Skin Test: NEGATIVE

## 2015-01-03 ENCOUNTER — Other Ambulatory Visit: Payer: Self-pay | Admitting: Family Medicine

## 2015-01-03 ENCOUNTER — Ambulatory Visit
Admission: RE | Admit: 2015-01-03 | Discharge: 2015-01-03 | Disposition: A | Discharge status: Home / Self Care | Payer: BLUE CROSS/BLUE SHIELD | Source: Ambulatory Visit | Attending: Family Medicine | Admitting: Family Medicine

## 2015-01-03 ENCOUNTER — Institutional Professional Consult (permissible substitution): Payer: BLUE CROSS/BLUE SHIELD | Admitting: Obstetrics & Gynecology

## 2015-01-03 DIAGNOSIS — R928 Other abnormal and inconclusive findings on diagnostic imaging of breast: Secondary | ICD-10-CM

## 2015-01-10 ENCOUNTER — Ambulatory Visit
Admission: RE | Admit: 2015-01-10 | Discharge: 2015-01-10 | Disposition: A | Discharge status: Home / Self Care | Payer: BLUE CROSS/BLUE SHIELD | Source: Ambulatory Visit | Attending: Family Medicine | Admitting: Family Medicine

## 2015-01-10 DIAGNOSIS — R928 Other abnormal and inconclusive findings on diagnostic imaging of breast: Secondary | ICD-10-CM

## 2015-01-22 ENCOUNTER — Other Ambulatory Visit: Payer: Self-pay | Admitting: Family Medicine

## 2015-01-25 ENCOUNTER — Other Ambulatory Visit: Payer: Self-pay | Admitting: *Deleted

## 2015-01-25 DIAGNOSIS — F341 Dysthymic disorder: Secondary | ICD-10-CM

## 2015-01-25 MED ORDER — FLUOXETINE HCL 40 MG PO CAPS: 40.0000 mg | ORAL_CAPSULE | Freq: Every day | ORAL | Status: DC

## 2015-02-22 ENCOUNTER — Other Ambulatory Visit: Payer: Self-pay | Admitting: Family Medicine

## 2015-02-22 NOTE — Telephone Encounter (Signed)
Called pt stating she need a f/u for more refills.

## 2015-03-05 ENCOUNTER — Encounter: Payer: Self-pay | Admitting: Family Medicine

## 2015-03-05 ENCOUNTER — Ambulatory Visit (INDEPENDENT_AMBULATORY_CARE_PROVIDER_SITE_OTHER): Payer: BLUE CROSS/BLUE SHIELD | Admitting: Family Medicine

## 2015-03-05 VITALS — BP 110/55 | HR 67 | Temp 98.7°F | Resp 18 | Wt 114.0 lb

## 2015-03-05 DIAGNOSIS — Z23 Encounter for immunization: Secondary | ICD-10-CM | POA: Diagnosis not present

## 2015-03-05 DIAGNOSIS — F329 Major depressive disorder, single episode, unspecified: Secondary | ICD-10-CM

## 2015-03-05 DIAGNOSIS — F419 Anxiety disorder, unspecified: Principal | ICD-10-CM

## 2015-03-05 DIAGNOSIS — F418 Other specified anxiety disorders: Secondary | ICD-10-CM

## 2015-03-05 MED ORDER — FLUOXETINE HCL 40 MG PO CAPS: 40.0000 mg | ORAL_CAPSULE | Freq: Every day | ORAL | Status: DC

## 2015-03-05 NOTE — Progress Notes (Signed)
   Subjective:    Patient ID: Joann Haynes, female    DOB: [birth year], 44 y.o.   MRN: [id number]  HPI F/U depression/ anxiety  - here today to follow-up for mood. Last followed up in November of last year. She's currently on fluoxetine 40 mg. When I last saw her it was working well for her overall.  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 She is doing very well at the program and a metastatic completed it except for doing her clinicals and decided it just wasn't for her. She's really looking now at becoming a Art therapist or a Copywriter, advertising. She feels like she would be much better suited for that..     Review of Systems     Objective:   Physical Exam  Constitutional: She is oriented to person, place, and time. She appears well-developed and well-nourished.  HENT:  Head: Normocephalic and atraumatic.  Cardiovascular: Normal rate, regular rhythm and normal heart sounds.   Pulmonary/Chest: Effort normal and breath sounds normal.  Neurological: She is alert and oriented to person, place, and time.  Skin: Skin is warm and dry.  Psychiatric: She has a normal mood and affect. Her behavior is normal.          Assessment & Plan:  Depression / anxiety -  Well controlled overall.  Marland Kitchen GAD- 7 score of 1 and PHQ- 9 score of 3 today. No thoughts of wanting to harm herself. Happy with her regimen. Continue current regimen. Follow-up in 6 months.  Flu vaccine - done today.   Reminded she is due for pap smear. Encouraged her to schedule her physical with Pap smear same.  Time spent 15 minutes, greater 50% at times in counseling about depression, anxiety and reminders for preventive care.

## 2015-03-09 ENCOUNTER — Other Ambulatory Visit: Payer: Self-pay | Admitting: Family Medicine

## 2015-08-14 ENCOUNTER — Other Ambulatory Visit: Payer: Self-pay | Admitting: Family Medicine

## 2015-11-15 ENCOUNTER — Encounter: Payer: Self-pay | Admitting: Family Medicine

## 2015-11-15 ENCOUNTER — Ambulatory Visit (INDEPENDENT_AMBULATORY_CARE_PROVIDER_SITE_OTHER): Payer: 59 | Admitting: Family Medicine

## 2015-11-15 VITALS — BP 109/66 | HR 73 | Wt 113.0 lb

## 2015-11-15 DIAGNOSIS — R5383 Other fatigue: Secondary | ICD-10-CM | POA: Diagnosis not present

## 2015-11-15 DIAGNOSIS — F419 Anxiety disorder, unspecified: Principal | ICD-10-CM

## 2015-11-15 DIAGNOSIS — F329 Major depressive disorder, single episode, unspecified: Secondary | ICD-10-CM

## 2015-11-15 DIAGNOSIS — F418 Other specified anxiety disorders: Secondary | ICD-10-CM | POA: Diagnosis not present

## 2015-11-15 MED ORDER — FLUOXETINE HCL 40 MG PO CAPS: 40.0000 mg | ORAL_CAPSULE | Freq: Every day | ORAL | 3 refills | Status: DC

## 2015-11-15 NOTE — Progress Notes (Signed)
Subjective:    CC: Follow up for anxiety depression  HPI: Here for six-month follow-up for anxiety depression. She's currently on fluoxetine 40 mg. She is happy with her current regimen. She says sometimes it makes her little extra sleepy but otherwise she does well with it. She does take it at bedtime. She she doesn't get at least 8 hours of sleep and she notices a difference in how she feels the next day. Sometimes she will come home and feel like she needs to take a nap because she just feels so sleepy. It then affects her sleep later that night. She does report feeling nervous and anxious more than several days of the week. 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 This was this would allow her to increase her income she will have additional certifications. She does also report feeling down several days of the week but no thoughts of wanting to harm herself.  Past medical history, Surgical history, Family history not pertinant except as noted below, Social history, Allergies, and medications have been entered into the medical record, reviewed, and corrections made.   Review of Systems: No fevers, chills, night sweats, weight loss, chest pain, or shortness of breath.   Objective:    General: Well Developed, well nourished, and in no acute distress.  Neuro: Alert and oriented x3, extra-ocular muscles intact, sensation grossly intact.  HEENT: Normocephalic, atraumatic  Skin: Warm and dry, no rashes. Cardiac: Regular rate and rhythm, no murmurs rubs or gallops, no lower extremity edema.  Respiratory: Clear to auscultation bilaterally. Not using accessory muscles, speaking in full sentences.   Impression and Recommendations:   Depression /anxiety-stable. PHQ 9 score of 5 today and dad 7 score of 5 today. We'll continue current regimen for now. Refills sent to the pharmacy. Follow-up in 6-12 months. She has had some improvements with her job which  is helpful and she is going to school.  She is overdue for blood work including cholesterol so did provide a lab slip for her today and encouraged her to go in the next couple weeks if she can. Also encouraged her to schedule her Pap smear and physical this fall since she is overdue.  Fatigue-we'll get some up-to-date blood work to evaluate for anemia and thyroid disorder.

## 2016-01-16 ENCOUNTER — Other Ambulatory Visit: Payer: Self-pay | Admitting: *Deleted

## 2016-01-16 MED ORDER — FLUOXETINE HCL 40 MG PO CAPS: 40.0000 mg | ORAL_CAPSULE | Freq: Every day | ORAL | 3 refills | Status: DC

## 2016-04-03 ENCOUNTER — Other Ambulatory Visit: Payer: Self-pay | Admitting: *Deleted

## 2016-04-03 MED ORDER — FLUOXETINE HCL 40 MG PO CAPS: 40.0000 mg | ORAL_CAPSULE | Freq: Every day | ORAL | 3 refills | Status: DC

## 2016-04-03 NOTE — Progress Notes (Signed)
Pt called and stated that she is in between insurance and will need her rx sent to her local pharmacy. rx sent.Audelia Hives [place]

## 2016-06-02 ENCOUNTER — Telehealth: Payer: Self-pay | Admitting: *Deleted

## 2016-06-02 NOTE — Telephone Encounter (Signed)
Called pt and lvm informing her that the form that she wants completed is voided due to the bottom of the form being mared with an "X" thru it where Dr. Gardiner Ramus signature would be. If this is still acceptable for her employer to take she will need to let our office know, otherwise she will need to bring in another form and allow enough time for the form to be completed. Advised that she rtn call to office to confirm this information.Joann Haynes [place]

## 2016-06-03 NOTE — Telephone Encounter (Signed)
Patient called back and advised that they will accept her form if Dr. Madilyn Fireman can still sign her portion and it can be faxed to [phone] Attn either [facility] or [facility]. Thanks

## 2016-06-04 NOTE — Telephone Encounter (Signed)
Form completed, faxed,scanned, confirmation received.Joann Haynes [place]

## 2016-12-18 ENCOUNTER — Ambulatory Visit: Payer: 59 | Admitting: Family Medicine

## 2017-02-20 ENCOUNTER — Encounter: Payer: Self-pay | Admitting: Family Medicine

## 2017-02-20 ENCOUNTER — Ambulatory Visit (INDEPENDENT_AMBULATORY_CARE_PROVIDER_SITE_OTHER): Payer: 59 | Admitting: Family Medicine

## 2017-02-20 VITALS — BP 108/44 | HR 68 | Ht 66.0 in | Wt 123.0 lb

## 2017-02-20 DIAGNOSIS — R350 Frequency of micturition: Secondary | ICD-10-CM

## 2017-02-20 DIAGNOSIS — Z23 Encounter for immunization: Secondary | ICD-10-CM

## 2017-02-20 DIAGNOSIS — R0781 Pleurodynia: Secondary | ICD-10-CM

## 2017-02-20 DIAGNOSIS — F341 Dysthymic disorder: Secondary | ICD-10-CM

## 2017-02-20 MED ORDER — FLUOXETINE HCL 20 MG PO TABS: 30.0000 mg | ORAL_TABLET | Freq: Every day | ORAL | 3 refills | Status: DC

## 2017-02-20 NOTE — Progress Notes (Addendum)
Subjective:    CC:   HPI: Follow-up anxiety/depression-currently on fluoxetine 40 mg daily.  She's actually been on this regimen for several years. Years ago she was on Zoloft. She feels like it causes urinary frequency. 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 She also has incontinence at night with it but that again has been going on for years. She said she's finally at the point where she thinks she might want to try something else. She also feels a little bit more flat on the medication and notices that she's not able to cry even when she feels like she should cry.  She also wanted me to look at her right lower rib area today. About a week ago she was in her garden her dog started to run off to chase may be a cat she's not sure. She fell forward and landed on her right ribs and her left outstretched palm. Her hand is actually feeling better but she still having soreness in the right lower rib area below the breast. It's worse when she coughs or sneezes. She's not currently doing any treatment. It is more painful when she tries to do sit ups.   BP (!) 108/44   Pulse 68   Ht 5\' 6"  (1.676 m)   Wt 123 lb (55.8 kg)   SpO2 99%   BMI 19.85 kg/m     No Known Allergies  Past Medical History:  Diagnosis Date  . Depression     No past surgical history on file.  Social History   Social History  . Marital status: Married    Spouse name: N/A  . Number of children: N/A  . Years of education: N/A   Occupational History  . Not on file.   Social History Main Topics  . Smoking status: Never Smoker  . Smokeless tobacco: Never Used  . Alcohol use No  . Drug use: No  . Sexual activity: Not on file     Comment: married, o kids, 2 yrs college, works at [employer], regularly exercises, does yoga/aerobic exercuse 3 times wk., 2 caffiene drinks per day.     Other Topics Concern  . Not on file   Social  History Narrative  . No narrative on file    Family History  Problem Relation Age of Onset  . Hypertension Mother   . Cancer Mother   . Cancer Maternal Uncle   . Diabetes Maternal Grandfather     Outpatient Encounter Prescriptions as of 02/20/2017  Medication Sig  . [DISCONTINUED] FLUoxetine (PROZAC) 40 MG capsule Take 1 capsule (40 mg total) by mouth daily.  Marland Kitchen FLUoxetine (PROZAC) 20 MG tablet Take 1.5 tablets (30 mg total) by mouth daily.   No facility-administered encounter medications on file as of 02/20/2017.        Objective:    General: Well Developed, well nourished, and in no acute distress.  Neuro: Alert and oriented x3, extra-ocular muscles intact, sensation grossly intact.  HEENT: Normocephalic, atraumatic  Skin: Warm and dry, no rashes. Cardiac: Regular rate and rhythm, no murmurs rubs or gallops, no lower extremity edema.  Respiratory: Clear to auscultation bilaterally. Not using accessory muscles, speaking in full sentences. MSK: pain over the right lower rib just below the breast area. No clicking or excessive movement of th rib.    Impression and Recommendations:    Anxiety/depression-discussed options. We can consider switching  the medication. She might do better with an S NRI such as Effexor or Cymbalta or Viibryd. We also discussed is trying to wean the medication completely to see if she still really needs it especially if it seems to be causing some side effects. She is opting for weaning the medication but wants to do it very very slowly so we'll decrease her fluoxetine from 40 mg down to 30 mg for the next 3 months and then at that point in time I will see her back. She does feel like she sleeps better when she takes her medication and is worried that she may have more problems with insomnia when she comes off. We'll see how she's doing in 3 months and address it at that time.  Urinary frequency  - unclear if this is a side effect of the medication or if she  may just actually have overactive bladder. We will certainly see as we wean the medication if her symptoms improve. If not and she may benefit from treatment or even further evaluation by urology.  Right lower anterior rib pain-possible bone bruise versus mild fracture. Do not feel any movement of the ribs that's worrisome for full fracture. Discussed option of getting an x-ray versus not as it doesn't really affect treatment. At this point she's not in severe pain so recommend just conservative therapy over the next few weeks. She'll call me back if it's not continuing to improve. She can hold pressure if she's given a cough or sneeze for comfort.  Time spent 30 minutes, greater than 50% time spent discussing her anxiety/depression, urinary frequency and treatment options. Flu vaccine given.

## 2017-03-05 ENCOUNTER — Other Ambulatory Visit: Payer: Self-pay | Admitting: Family Medicine

## 2017-04-03 ENCOUNTER — Telehealth: Payer: Self-pay

## 2017-04-03 MED ORDER — FLUOXETINE HCL 40 MG PO CAPS: 40.0000 mg | ORAL_CAPSULE | Freq: Every day | ORAL | 1 refills | Status: DC

## 2017-04-03 NOTE — Telephone Encounter (Signed)
OK for prozac 40mg .  Ok to refill Old Rx for 90 day with 1 rf

## 2017-04-03 NOTE — Telephone Encounter (Signed)
Medication sent.

## 2017-04-03 NOTE — Telephone Encounter (Signed)
Jackqueline called and states she never switched to the Prozac 20 mg 1.5 tablets. She needs a refill for the Prozac 40 mg. Please advised. Prozac 20 mg 1.5 tablets daily is in the chart.

## 2017-04-15 ENCOUNTER — Ambulatory Visit: Payer: 59 | Admitting: Physician Assistant

## 2017-04-15 ENCOUNTER — Other Ambulatory Visit (HOSPITAL_COMMUNITY)
Admission: RE | Admit: 2017-04-15 | Discharge: 2017-04-15 | Disposition: A | Discharge status: Home / Self Care | Payer: 59 | Source: Ambulatory Visit | Attending: Physician Assistant | Admitting: Physician Assistant

## 2017-04-15 ENCOUNTER — Encounter: Payer: Self-pay | Admitting: Physician Assistant

## 2017-04-15 VITALS — BP 90/58 | HR 75 | Temp 98.2°F | Resp 16 | Ht 67.0 in | Wt 120.4 lb

## 2017-04-15 DIAGNOSIS — N889 Noninflammatory disorder of cervix uteri, unspecified: Secondary | ICD-10-CM | POA: Insufficient documentation

## 2017-04-15 DIAGNOSIS — R350 Frequency of micturition: Secondary | ICD-10-CM | POA: Insufficient documentation

## 2017-04-15 DIAGNOSIS — R3129 Other microscopic hematuria: Secondary | ICD-10-CM

## 2017-04-15 DIAGNOSIS — N939 Abnormal uterine and vaginal bleeding, unspecified: Secondary | ICD-10-CM | POA: Diagnosis not present

## 2017-04-15 DIAGNOSIS — Z124 Encounter for screening for malignant neoplasm of cervix: Secondary | ICD-10-CM | POA: Insufficient documentation

## 2017-04-15 LAB — POCT URINALYSIS DIPSTICK
Bilirubin, UA: NEGATIVE
GLUCOSE UA: NEGATIVE
LEUKOCYTES UA: NEGATIVE
NITRITE UA: NEGATIVE
SPEC GRAV UA: 1.02 (ref 1.010–1.025)
Urobilinogen, UA: 2 E.U./dL — AB
pH, UA: 7 (ref 5.0–8.0)

## 2017-04-15 LAB — POCT URINE PREGNANCY: PREG TEST UR: NEGATIVE

## 2017-04-15 NOTE — Progress Notes (Signed)
HPI:                                                                Joann Haynes is a 46 y.o. female who presents to [facility] today for abnormal menses  Pleasant G0P0 reports spotting and post-coital bleeding for the past month. Bleeding usually requires 1 pad/tampon and lasts hours to 1 day. Denies constitutional symptoms, abdominal bloating or pelvic pain. Denies dyspareunia. Pap smear >5 years ago.  GYN/Sexual Health  Menstrual status: having periods  Menarche: age 40 years  LMP: 04/05/17  Menses: regular  Last pap smear: 2012, NILM  History of abnormal pap smears: no  Sexually active: yes, 1 partner  Current contraception: none  Health Maintenance Health Maintenance  Topic Date Due  . HIV Screening  08/02/1984  . TETANUS/TDAP  02/09/2021  . PAP SMEAR  04/15/2022  . INFLUENZA VACCINE  Completed    Past Medical History:  Diagnosis Date  . Bed wetting 09/12/2010  . Depression    History reviewed. No pertinent surgical history. Social History   Tobacco Use  . Smoking status: Never Smoker  . Smokeless tobacco: Never Used  Substance Use Topics  . Alcohol use: No   family history includes Cancer in her maternal uncle and mother; Diabetes in her maternal grandfather; Hypertension in her mother.  ROS: negative except as noted in the HPI  Medications: Current Outpatient Medications  Medication Sig Dispense Refill  . FLUoxetine (PROZAC) 40 MG capsule Take 1 capsule (40 mg total) by mouth daily. 90 capsule 1   No current facility-administered medications for this visit.    No Known Allergies     Objective:  BP (!) 90/58   Pulse 75   Temp 98.2 F (36.8 C) (Oral)   Resp 16   Ht 5\' 7"  (1.702 m)   Wt 120 lb 6.4 oz (54.6 kg)   LMP 04/05/2017 (Within Weeks)   SpO2 99%   BMI 18.86 kg/m  Gen: well-groomed, cooperative, not ill-appearing, no distress HEENT: head normocephalic atraumatic GU: vulva without  rashes or lesions, normal introitus and urethral meatus, vaginal mucosa without erythema, no discharge, cervix with purple-blackish bulb shaped lesion protruding from the os at 6 o'clock  A chaperone was used for the GU portion of the exam, Cristino Martes, LPN.     No results found for this or any previous visit (from the past 72 hour(s)). No results found.    Assessment and Plan: 46 y.o. female with   1. Abnormal uterine bleeding (AUB) - POCT urinalysis dipstick positive for small blood - POCT urine pregnancy negative - CBC - Fe+TIBC+Fer - TSH - Ambulatory referral to Obstetrics / Gynecology  2. Encounter for Pap smear of cervix with HPV DNA cotesting - Cytology - PAP pending  3. Cervical lesion - lesion is most consistent with cervical or uterine polyp. Referring to GYN for excision - Ambulatory referral to Obstetrics / Gynecology  4. Microscopic hematuria   Orders Placed This Encounter  Procedures  . CBC  . Fe+TIBC+Fer  . TSH  . Ambulatory referral to Obstetrics / Gynecology    Referral Priority:   Routine    Referral Type:   Consultation    Referral Reason:   Specialty Services  Required    Requested Specialty:   Obstetrics and Gynecology    Number of Visits Requested:   1  . POCT urinalysis dipstick  . POCT urine pregnancy     Patient education and anticipatory guidance given Patient agrees with treatment plan Follow-up as needed if symptoms worsen or fail to improve  Darlyne Russian PA-C

## 2017-04-16 LAB — CBC
HCT: 35.1 % (ref 35.0–45.0)
HEMOGLOBIN: 12.2 g/dL (ref 11.7–15.5)
MCH: 31 pg (ref 27.0–33.0)
MCHC: 34.8 g/dL (ref 32.0–36.0)
MCV: 89.1 fL (ref 80.0–100.0)
MPV: 10.1 fL (ref 7.5–12.5)
PLATELETS: 276 10*3/uL (ref 140–400)
RBC: 3.94 10*6/uL (ref 3.80–5.10)
RDW: 11.5 % (ref 11.0–15.0)
WBC: 6.9 10*3/uL (ref 3.8–10.8)

## 2017-04-16 LAB — IRON,TIBC AND FERRITIN PANEL
%SAT: 22 % (ref 11–50)
FERRITIN: 61 ng/mL (ref 10–232)
IRON: 58 ug/dL (ref 40–190)
TIBC: 259 ug/dL (ref 250–450)

## 2017-04-16 LAB — TSH: TSH: 2.39 m[IU]/L

## 2017-04-16 NOTE — Progress Notes (Signed)
Labs look great. There is no evidence of anemia. Follow-up with GYN as discussed for polyp removal

## 2017-04-22 LAB — CYTOLOGY - PAP
Diagnosis: NEGATIVE
HPV: NOT DETECTED

## 2017-04-22 NOTE — Progress Notes (Signed)
Normal Pap smear Recommend repeat screening in 5 years

## 2017-04-24 ENCOUNTER — Telehealth: Payer: Self-pay

## 2017-04-24 NOTE — Telephone Encounter (Signed)
Called pt left message for pt to call office. Received referral from primary care.

## 2017-04-25 ENCOUNTER — Encounter: Payer: Self-pay | Admitting: Physician Assistant

## 2017-07-30 ENCOUNTER — Encounter: Payer: 59 | Admitting: Obstetrics & Gynecology

## 2017-08-03 ENCOUNTER — Encounter: Payer: 59 | Admitting: Obstetrics & Gynecology

## 2017-08-04 ENCOUNTER — Encounter: Payer: 59 | Admitting: Obstetrics & Gynecology

## 2017-08-18 ENCOUNTER — Ambulatory Visit: Payer: 59 | Admitting: Obstetrics and Gynecology

## 2017-08-18 ENCOUNTER — Encounter: Payer: Self-pay | Admitting: Obstetrics and Gynecology

## 2017-08-18 VITALS — BP 91/52 | HR 70 | Ht 66.0 in | Wt 117.0 lb

## 2017-08-18 DIAGNOSIS — Z888 Allergy status to other drugs, medicaments and biological substances status: Secondary | ICD-10-CM | POA: Diagnosis not present

## 2017-08-18 DIAGNOSIS — Z01419 Encounter for gynecological examination (general) (routine) without abnormal findings: Secondary | ICD-10-CM

## 2017-08-18 NOTE — Progress Notes (Signed)
Possible cervical polyp

## 2017-08-18 NOTE — Progress Notes (Signed)
47 yo G0 with BMI 18 and LMP 08/18/2017 referred for the evaluation of postcoital vaginal bleeding and a cervical mass. Patient reports complete resolution of postcoital vaginal bleeding since her 03/2017 visit. She described it as one day of vaginal spotting following intercourse which has completely resolved. She is without any other complaints. She denies pelvic pain or abnormal discharge.  Past Medical History:  Diagnosis Date  . Bed wetting 09/12/2010  . Depression    History reviewed. No pertinent surgical history. Family History  Problem Relation Age of Onset  . Hypertension Mother   . Cancer Mother   . Cancer Maternal Uncle   . Diabetes Maternal Grandfather    Social History   Tobacco Use  . Smoking status: Never Smoker  . Smokeless tobacco: Never Used  Substance Use Topics  . Alcohol use: No  . Drug use: No   ROS See pertinent in HPI  Blood pressure (!) 91/52, pulse 70, height 5\' 6"  (1.676 m), weight 117 lb (53.1 kg), last menstrual period 08/18/2017. GENERAL: Well-developed, well-nourished female in no acute distress.  ABDOMEN: Soft, nontender, nondistended. No organomegaly. PELVIC: Normal external female genitalia. Vagina is pink and rugated.  Normal discharge. Normal appearing cervix. No mass visualized at 6'o'clock. Uterus is normal in size. No adnexal mass or tenderness. EXTREMITIES: No cyanosis, clubbing, or edema, 2+ distal pulses.  A/P 47 yo with history of postcoital vaginal bleeding and previously seen cervical mass - patient with normal pap smear 03/2017 - Suspect aborting cervical polyp as possible etiology of postcoital bleeding - reassurance provided - patient advised to return if symptoms return

## 2017-10-07 ENCOUNTER — Other Ambulatory Visit: Payer: Self-pay | Admitting: Family Medicine

## 2018-03-02 ENCOUNTER — Encounter: Payer: Self-pay | Admitting: Family Medicine

## 2018-03-02 ENCOUNTER — Ambulatory Visit (INDEPENDENT_AMBULATORY_CARE_PROVIDER_SITE_OTHER): Payer: 59 | Admitting: Family Medicine

## 2018-03-02 VITALS — BP 99/56 | HR 79 | Ht 65.35 in | Wt 117.0 lb

## 2018-03-02 DIAGNOSIS — R2232 Localized swelling, mass and lump, left upper limb: Secondary | ICD-10-CM

## 2018-03-02 DIAGNOSIS — T148XXA Other injury of unspecified body region, initial encounter: Secondary | ICD-10-CM | POA: Diagnosis not present

## 2018-03-02 DIAGNOSIS — Z23 Encounter for immunization: Secondary | ICD-10-CM

## 2018-03-02 MED ORDER — DOXYCYCLINE HYCLATE 100 MG PO TABS: 100.0000 mg | ORAL_TABLET | Freq: Two times a day (BID) | ORAL | 0 refills | Status: DC

## 2018-03-02 NOTE — Progress Notes (Signed)
Subjective:    Patient ID: Joann Haynes, female    DOB: 1970-04-24, 47 y.o.   MRN: [id number]  HPI  47 year old female is here today for a bruise/lump that she noticed on her left lower arm near her elbow on Saturday, November 9.  She denies any known injury or trauma. The area is not painful, there is a palpable knot underneath the skin. Says she noticed a new red area today.  He says sometimes her dog will jump up and scratched her but she never noticed any type of skin lesion or scratch or insect bite.  Review of Systems  BP (!) 99/56   Pulse 79   Ht 5' 5.35" (1.66 m)   Wt 117 lb (53.1 kg)   SpO2 100%   BMI 19.26 kg/m     No Known Allergies  Past Medical History:  Diagnosis Date  . Bed wetting 09/12/2010  . Depression     History reviewed. No pertinent surgical history.  Social History   Socioeconomic History  . Marital status: Married    Spouse name: Not on file  . Number of children: Not on file  . Years of education: Not on file  . Highest education level: Not on file  Occupational History  . Not on file  Social Needs  . Financial resource strain: Not on file  . Food insecurity:    Worry: Not on file    Inability: Not on file  . Transportation needs:    Medical: Not on file    Non-medical: Not on file  Tobacco Use  . Smoking status: Never Smoker  . Smokeless tobacco: Never Used  Substance and Sexual Activity  . Alcohol use: No  . Drug use: No  . Sexual activity: Yes    Birth control/protection: None    Comment: married, o kids, 2 yrs college, works at [employer], regularly exercises, does yoga/aerobic exercuse 3 times wk., 2 caffiene drinks per day.    Lifestyle  . Physical activity:    Days per week: Not on file    Minutes per session: Not on file  . Stress: Not on file  Relationships  . Social connections:    Talks on phone: Not on file    Gets together: Not on file    Attends religious service: Not on file    Active member of club or  organization: Not on file    Attends meetings of clubs or organizations: Not on file    Relationship status: Not on file  . Intimate partner violence:    Fear of current or ex partner: Not on file    Emotionally abused: Not on file    Physically abused: Not on file    Forced sexual activity: Not on file  Other Topics Concern  . Not on file  Social History Narrative  . Not on file    Family History  Problem Relation Age of Onset  . Hypertension Mother   . Cancer Mother   . Cancer Maternal Uncle   . Diabetes Maternal Grandfather     Outpatient Encounter Medications as of 03/02/2018  Medication Sig  . FLUoxetine (PROZAC) 40 MG capsule TAKE 1 CAPSULE BY MOUTH EVERY DAY  . doxycycline (VIBRA-TABS) 100 MG tablet Take 1 tablet (100 mg total) by mouth 2 (two) times daily.   No facility-administered encounter medications on file as of 03/02/2018.          Objective:   Physical Exam  Constitutional: She is oriented  to person, place, and time. She appears well-developed and well-nourished.  HENT:  Head: Normocephalic and atraumatic.  Eyes: Conjunctivae and EOM are normal.  Cardiovascular: Normal rate.  Pulmonary/Chest: Effort normal.  Neurological: She is alert and oriented to person, place, and time.  Skin: Skin is dry. No pallor.  On her left arm near the elbow she has a 2 in x 1.5 in bruised area and in the center she has a smaller area approximately 1 to 1-1/2 cm in side that is firm but yet still compressible.  And just to the distal edge of the bruising there is a small area of erythema approximately 1 cm as well.  Psychiatric: She has a normal mood and affect. Her behavior is normal.  Vitals reviewed.         Assessment & Plan:  Mass on left arm/Hematoma - unclear etiology.  This is a little unusual lesion especially with the central area that is raised and firm but yet compressible.  Suspect hematoma since she has a large bruise around the area but could also be an  abscess or infection.  Especially since she has a little bit of erythema distal dome lesion.  We will treat her with doxycycline.  Call if not improving by the end of the week.  Call sooner if she feels like it is getting worse, spreading, or she develops fevers chills or an open wound.

## 2018-03-02 NOTE — Patient Instructions (Addendum)
It could be a hematoma ( blood pocket from an injury).

## 2018-05-07 ENCOUNTER — Other Ambulatory Visit: Payer: Self-pay | Admitting: Family Medicine

## 2018-07-29 ENCOUNTER — Ambulatory Visit: Payer: 59 | Admitting: Family Medicine

## 2018-07-29 ENCOUNTER — Ambulatory Visit (INDEPENDENT_AMBULATORY_CARE_PROVIDER_SITE_OTHER): Payer: PRIVATE HEALTH INSURANCE | Admitting: Family Medicine

## 2018-07-29 ENCOUNTER — Encounter: Payer: Self-pay | Admitting: Family Medicine

## 2018-07-29 VITALS — BP 95/47 | HR 78 | Temp 98.9°F | Ht 65.0 in | Wt 117.0 lb

## 2018-07-29 DIAGNOSIS — F418 Other specified anxiety disorders: Secondary | ICD-10-CM | POA: Diagnosis not present

## 2018-07-29 DIAGNOSIS — L821 Other seborrheic keratosis: Secondary | ICD-10-CM | POA: Diagnosis not present

## 2018-07-29 NOTE — Progress Notes (Signed)
Acute Office Visit  Subjective:    Patient ID: Joann Haynes, female    DOB: [birth year], 48 y.o.   MRN: [id number]  Chief Complaint  Patient presents with  . Nevus    pt reports that she noticed a change in an area of skin near her hairline on the L side about 1.5 mos ago. she states that the area itches some she has not used anything on it. she has notice that is has gotten larger     HPI Patient is in today for skin lesions.  She has a lesion on her left upper forehead that she noticed about 1.5 mo agoa.  She says occ it itches but it is not painful or bothersome.    She wanted me to look at a few moles that she is concerned about.   She also wanted to let me know she is feeling really down lately. She and her husband had a friend who is much younger who they let stay with them for some time.  They had really taken him in a most like a family member.  But more recently he and her husband got into it a little bit and he has now left.  She says she is actually missing the friendship in the relationship and has been quite sad about it.  Plus with the worry and stress from the current coated pandemic as she still has to go into work it has been stressful.  Past Medical History:  Diagnosis Date  . Bed wetting 09/12/2010  . Depression     History reviewed. No pertinent surgical history.  Family History  Problem Relation Age of Onset  . Hypertension Mother   . Cancer Mother   . Cancer Maternal Uncle   . Diabetes Maternal Grandfather     Social History   Socioeconomic History  . Marital status: Married    Spouse name: Not on file  . Number of children: Not on file  . Years of education: Not on file  . Highest education level: Not on file  Occupational History  . Not on file  Social Needs  . Financial resource strain: Not on file  . Food insecurity:    Worry: Not on file    Inability: Not on file  . Transportation needs:    Medical: Not on file    Non-medical: Not on file   Tobacco Use  . Smoking status: Never Smoker  . Smokeless tobacco: Never Used  Substance and Sexual Activity  . Alcohol use: No  . Drug use: No  . Sexual activity: Yes    Birth control/protection: None    Comment: married, o kids, 2 yrs college, works at [employer], regularly exercises, does yoga/aerobic exercuse 3 times wk., 2 caffiene drinks per day.    Lifestyle  . Physical activity:    Days per week: Not on file    Minutes per session: Not on file  . Stress: Not on file  Relationships  . Social connections:    Talks on phone: Not on file    Gets together: Not on file    Attends religious service: Not on file    Active member of club or organization: Not on file    Attends meetings of clubs or organizations: Not on file    Relationship status: Not on file  . Intimate partner violence:    Fear of current or ex partner: Not on file    Emotionally abused: Not on file  Physically abused: Not on file    Forced sexual activity: Not on file  Other Topics Concern  . Not on file  Social History Narrative  . Not on file    Outpatient Medications Prior to Visit  Medication Sig Dispense Refill  . FLUoxetine (PROZAC) 40 MG capsule TAKE 1 CAPSULE BY MOUTH DAILY 90 capsule 1  . doxycycline (VIBRA-TABS) 100 MG tablet Take 1 tablet (100 mg total) by mouth 2 (two) times daily. 20 tablet 0   No facility-administered medications prior to visit.     No Known Allergies  ROS     Objective:    Physical Exam  Constitutional: She is oriented to person, place, and time. She appears well-developed and well-nourished.  HENT:  Head: Normocephalic and atraumatic.  Eyes: Conjunctivae and EOM are normal.  Cardiovascular: Normal rate.  Pulmonary/Chest: Effort normal.  Neurological: She is alert and oriented to person, place, and time.  Skin: Skin is dry. No pallor.  She has an approximately 1.0 x 0.5 cm seborrheic keratosis on the left upper scalp line.  She has a lot of freckling over  her chest and face and forearms.  I did examine her back as well and just saw some cherry angiomas freckling and some moles.  Nothing atypical or worrisome and I gave her reassurance today.  Psychiatric: She has a normal mood and affect. Her behavior is normal.  Vitals reviewed.   BP (!) 95/47   Pulse 78   Temp 98.9 F (37.2 C)   Ht 5\' 5"  (1.651 m)   Wt 117 lb (53.1 kg)   SpO2 100%   BMI 19.47 kg/m  Wt Readings from Last 3 Encounters:  07/29/18 117 lb (53.1 kg)  03/02/18 117 lb (53.1 kg)  08/18/17 117 lb (53.1 kg)    Health Maintenance Due  Topic Date Due  . HIV Screening  08/02/1984    There are no preventive care reminders to display for this patient.   Lab Results  Component Value Date   TSH 2.39 04/15/2017   Lab Results  Component Value Date   WBC 6.9 04/15/2017   HGB 12.2 04/15/2017   HCT 35.1 04/15/2017   MCV 89.1 04/15/2017   PLT 276 04/15/2017   Lab Results  Component Value Date   NA 139 07/12/2010   K 4.3 07/12/2010   CO2 25 07/12/2010   GLUCOSE 82 07/12/2010   BUN 12 07/12/2010   CREATININE 0.77 07/12/2010   BILITOT 0.7 07/12/2010   ALKPHOS 64 07/12/2010   AST 17 07/12/2010   ALT 13 07/12/2010   PROT 7.4 07/12/2010   ALBUMIN 4.7 07/12/2010   CALCIUM 9.5 07/12/2010   Lab Results  Component Value Date   CHOL 136 07/12/2010   Lab Results  Component Value Date   HDL 54 07/12/2010   Lab Results  Component Value Date   LDLCALC 72 07/12/2010   Lab Results  Component Value Date   TRIG 52 07/12/2010   Lab Results  Component Value Date   CHOLHDL 2.5 07/12/2010   No results found for: HGBA1C     Assessment & Plan:   Problem List Items Addressed This Visit    None    Visit Diagnoses    Seborrheic keratoses    -  Primary   Depression with anxiety         Seborrheic keratoses-discussed definitive treatment would be cryotherapy but it is a benign lesion so she can certainly opt to leave it.  Based on  the size she would like to go  ahead and have it treated before it gets larger.  I did do a skin check over her back and forearms and face I did not see any other worrisome lesions today.  Follow-up wound care discussed.  Depression with anxiety-she did mark that she had had thoughts of being better off dead but feels like she will get through this she does feel safe to go home.  We discussed possibly adjusting her medication for now but she declined but encouraged her to think about it.  We can also refer her to 1 of our behavioral health specialist here in the office for virtual visit.  Continue fluoxetine 40 mg.    Cryotherapy Procedure Note  Pre-operative Diagnosis: Seborrheic keratoses  Post-operative Diagnosis: same  Locations: left forehead  Indications: wants removal   Anesthesia: not needed.   Procedure Details  Patient informed of risks (permanent scarring, infection, light or dark discoloration, bleeding, infection, weakness, numbness and recurrence of the lesion) and benefits of the procedure and verbal informed consent obtained.  The areas are treated with liquid nitrogen therapy, frozen until ice ball extended 1-2 mm beyond lesion, allowed to thaw, and treated again. The patient tolerated procedure well.  The patient was instructed on post-op care, warned that there may be blister formation, redness and pain. Recommend OTC analgesia as needed for pain.  Condition: Stable  Complications: none.  Plan: 1. Instructed to keep the area dry and covered for 24-48h and clean thereafter. 2. Warning signs of infection were reviewed.   3. Recommended that the patient use OTC acetaminophen as needed for pain.  4. Return PRN.     No orders of the defined types were placed in this encounter.    Beatrice Lecher, MD

## 2018-08-02 ENCOUNTER — Ambulatory Visit: Payer: Self-pay | Admitting: Family Medicine

## 2018-10-04 ENCOUNTER — Encounter: Payer: Self-pay | Admitting: Family Medicine

## 2018-10-04 ENCOUNTER — Ambulatory Visit (INDEPENDENT_AMBULATORY_CARE_PROVIDER_SITE_OTHER): Payer: PRIVATE HEALTH INSURANCE | Admitting: Family Medicine

## 2018-10-04 ENCOUNTER — Telehealth: Payer: Self-pay | Admitting: Family Medicine

## 2018-10-04 VITALS — Temp 97.8°F

## 2018-10-04 DIAGNOSIS — R519 Headache, unspecified: Secondary | ICD-10-CM

## 2018-10-04 DIAGNOSIS — R51 Headache: Secondary | ICD-10-CM | POA: Diagnosis not present

## 2018-10-04 DIAGNOSIS — R110 Nausea: Secondary | ICD-10-CM

## 2018-10-04 DIAGNOSIS — Z20828 Contact with and (suspected) exposure to other viral communicable diseases: Secondary | ICD-10-CM | POA: Diagnosis not present

## 2018-10-04 DIAGNOSIS — Z20822 Contact with and (suspected) exposure to covid-19: Secondary | ICD-10-CM

## 2018-10-04 NOTE — Telephone Encounter (Signed)
Telephone visit scheduled

## 2018-10-04 NOTE — Telephone Encounter (Signed)
Pt called. One of her co-workers tested positive for [place]. Her employer wants her to get checked out because she called in sick on Saturday.  Her symptoms were: headaches and dry heaving..she said she took tylenol and she's feeling better but she still needs an appointment to get checked out.

## 2018-10-04 NOTE — Progress Notes (Signed)
Pt reports that she found out that coworker had COVID 19 on Saturday. 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   She has had  Headaches, nausea over the weekend. She said that this has gotten better.  She hasn't been quarantining from her husband since finding this out.   Maryruth Eve, [place] Crocker, CMA

## 2018-10-04 NOTE — Progress Notes (Signed)
Virtual Visit via Telephone Note  I connected with Joann Haynes on 10/04/18 at  3:00 PM EDT by telephone and verified that I am speaking with the correct person using two identifiers.   I discussed the limitations, risks, security and privacy concerns of performing an evaluation and management service by telephone and the availability of in person appointments. I also discussed with the patient that there may be a patient responsible charge related to this service. The patient expressed understanding and agreed to proceed.  Pt was at home and I was in my office for the virtual visit.     Subjective:    CC: Possible exposure to COVID.  HPI:   A co-worker was diagnosed with COVID and they work right close to each other.  She did have a mask but less than 6 feet apart.  She has been out of work for a few days.  Co-worker has been out for about 2 weeks.    She had a bad HA and some nausea and dry heaving on Saturday.  She called out of work. Never actually vomited.  no fever or chills, etc. She felt by Sunday.  Today feels fine. Took some tylenol   No cough or SOB.  NO diarrhea.  + husband with a sore throat.    Past medical history, Surgical history, Family history not pertinant except as noted below, Social history, Allergies, and medications have been entered into the medical record, reviewed, and corrections made.   Review of Systems: No fevers, chills, night sweats, weight loss, chest pain, or shortness of breath.   Objective:    General: Speaking clearly in complete sentences without any shortness of breath.  Alert and oriented x3.  Normal judgment. No apparent acute distress.    Impression and Recommendations:   Nausea/Headache - symptoms less than 24 hours. She has felt better for 48 hours, so Ok to return to work. Low risk so OK to return to work. She will call me back if she wants to get's tested.  Since she did have a positive exposure at work we could consider testing if she  gets new symptoms or if it would just help reassure her to be tested she can let me know.     I discussed the assessment and treatment plan with the patient. The patient was provided an opportunity to ask questions and all were answered. The patient agreed with the plan and demonstrated an understanding of the instructions.   The patient was advised to call back or seek an in-person evaluation if the symptoms worsen or if the condition fails to improve as anticipated.  I provided 15 minutes of non-face-to-face time during this encounter.   Beatrice Lecher, MD

## 2018-10-23 ENCOUNTER — Other Ambulatory Visit: Payer: Self-pay | Admitting: Family Medicine

## 2019-04-12 ENCOUNTER — Ambulatory Visit (INDEPENDENT_AMBULATORY_CARE_PROVIDER_SITE_OTHER): Payer: PRIVATE HEALTH INSURANCE | Admitting: Family Medicine

## 2019-04-12 ENCOUNTER — Ambulatory Visit: Payer: PRIVATE HEALTH INSURANCE

## 2019-04-12 ENCOUNTER — Encounter: Payer: Self-pay | Admitting: Family Medicine

## 2019-04-12 DIAGNOSIS — R1111 Vomiting without nausea: Secondary | ICD-10-CM

## 2019-04-12 NOTE — Progress Notes (Signed)
sxs began yesterday. She stated that when she woke up this morning she tried drinking coffee and she vomited up yellow   She was told that she has to be tested before she can return to work and her test has to be negative before she can return. She stated that this is new with her employer.   She denies f/s/c/n/d

## 2019-04-12 NOTE — Addendum Note (Signed)
Addended by: Towana Badger on: 04/12/2019 04:04 PM   Modules accepted: Orders

## 2019-04-12 NOTE — Progress Notes (Signed)
Virtual Visit via Telephone Note  I connected with Joann Haynes on 04/12/19 at  3:20 PM EST by telephone and verified that I am speaking with the correct person using two identifiers.   I discussed the limitations, risks, security and privacy concerns of performing an evaluation and management service by telephone and the availability of in person appointments. I also discussed with the patient that there may be a patient responsible charge related to this service. The patient expressed understanding and agreed to proceed.   Subjective:    CC: vomiting.   HPI:  sxs began yesterday. She stated that when she woke up this morning around 5:20AM.  Vomited twice back to back.  She tried drinking coffee and she vomited up yellow.  No fever, sweats or chills. Ate [employer] last night.  No bodyaches.  She hasn't vomited again. Just feels tired.  When else has been sick at home.  No other upper respiratory symptoms.   He has been able to eat a little bit of toast today and has done okay so far.  No significant nausea.  No diarrhea.  She was told that she has to be tested before she can return to work and her test has to be negative before she can return. She stated that this is new with her employer.   Past medical history, Surgical history, Family history not pertinant except as noted below, Social history, Allergies, and medications have been entered into the medical record, reviewed, and corrections made.   Review of Systems: No fevers, chills, night sweats, weight loss, chest pain, or shortness of breath.   Objective:    General: Speaking clearly in complete sentences without any shortness of breath.  Alert and oriented x3.  Normal judgment. No apparent acute distress.    Impression and Recommendations:    Vomiting - seems to have resolved.  Unclear if she may have had food poisoning or not.  But she does have to get Covid tested before she is able to return so get her get set up to get  swabbed if she develops any new symptoms including fever or repeat vomiting then please let us know.  She is not currently nauseated so I did not send any nausea medicine over to the pharmacy.    I discussed the assessment and treatment plan with the patient. The patient was provided an opportunity to ask questions and all were answered. The patient agreed with the plan and demonstrated an understanding of the instructions.   The patient was advised to call back or seek an in-person evaluation if the symptoms worsen or if the condition fails to improve as anticipated.  I provided 15 minutes of non-face-to-face time during this encounter.   Beatrice Lecher, MD

## 2019-04-13 ENCOUNTER — Ambulatory Visit: Payer: PRIVATE HEALTH INSURANCE | Admitting: Sports Medicine

## 2019-04-13 LAB — NOVEL CORONAVIRUS, NAA: SARS-CoV-2, NAA: NOT DETECTED

## 2019-04-18 ENCOUNTER — Telehealth: Payer: Self-pay

## 2019-04-18 ENCOUNTER — Encounter: Payer: Self-pay | Admitting: Family Medicine

## 2019-04-18 NOTE — Telephone Encounter (Signed)
Joann Haynes would like a return to work stating she is negative for COVID-19 and it ok to return.   Fax - [phone]

## 2019-04-18 NOTE — Telephone Encounter (Signed)
Left message to see if patient needs a return to work date.

## 2019-04-18 NOTE — Telephone Encounter (Signed)
Letter written and patient will pick up.

## 2019-04-18 NOTE — Telephone Encounter (Signed)
OK for work note.  

## 2019-05-04 ENCOUNTER — Other Ambulatory Visit: Payer: Self-pay | Admitting: Family Medicine

## 2019-05-18 ENCOUNTER — Telehealth: Payer: Self-pay

## 2019-05-18 MED ORDER — FLUOXETINE HCL 40 MG PO CAPS: 40.0000 mg | ORAL_CAPSULE | Freq: Every day | ORAL | 1 refills | Status: DC

## 2019-05-18 NOTE — Telephone Encounter (Signed)
Patient called back and gave the pharmacy that she wanted this sent in and not [facility].

## 2019-05-18 NOTE — Telephone Encounter (Signed)
Joann Haynes called and left a message requesting a refill on Fluoxetine. She stated she wanted it sent to [facility]. I called patient and left a message asking which [facility].

## 2019-05-31 ENCOUNTER — Telehealth (INDEPENDENT_AMBULATORY_CARE_PROVIDER_SITE_OTHER): Payer: PRIVATE HEALTH INSURANCE | Admitting: Family Medicine

## 2019-05-31 ENCOUNTER — Other Ambulatory Visit: Payer: Self-pay

## 2019-05-31 ENCOUNTER — Encounter: Payer: Self-pay | Admitting: Family Medicine

## 2019-05-31 ENCOUNTER — Telehealth: Payer: Self-pay | Admitting: Family Medicine

## 2019-05-31 VITALS — Temp 100.6°F | Ht 65.0 in | Wt 117.0 lb

## 2019-05-31 DIAGNOSIS — R112 Nausea with vomiting, unspecified: Secondary | ICD-10-CM

## 2019-05-31 MED ORDER — ONDANSETRON 4 MG PO TBDP: 4.0000 mg | ORAL_TABLET | Freq: Three times a day (TID) | ORAL | 0 refills | Status: DC | PRN

## 2019-05-31 NOTE — Progress Notes (Signed)
Pt reports that she started experiencing sxs yesterday around 8 pm, vomiting and headache.

## 2019-05-31 NOTE — Telephone Encounter (Signed)
Joann Haynes called back and states she needs the note faxed to a different fax number.   [phone]

## 2019-05-31 NOTE — Telephone Encounter (Signed)
The fax number for her job is: [phone]. She did not give a name to attention it to. Thank you.

## 2019-05-31 NOTE — Telephone Encounter (Signed)
Work note faxed.Joann Haynes, Joann Haynes, CMA

## 2019-05-31 NOTE — Progress Notes (Signed)
Virtual Visit via Video Note  I connected with Joann Haynes on 05/31/19 at 10:10 AM EST by a video enabled telemedicine application and verified that I am speaking with the correct person using two identifiers.   I discussed the limitations of evaluation and management by telemedicine and the availability of in person appointments. The patient expressed understanding and agreed to proceed.  Subjective:    CC:   HPI: 49 year old female says that last evening around 8 PM she was actually cleaning her bathroom and started to feel a little nauseated.  She then started vomiting.  Initially she thought maybe it was just from the chemicals but then continued to vomit about every 30 minutes.  It then started to space out to every hour and then finally every 2 hours.  This morning she was actually able to get a couple crackers without vomiting and says she feels a little bit better.  She did have a headache mostly over her forehead but denies any other cold or respiratory symptoms or sinus congestion.  She did have some dry heaves early into the morning.  She has not had any diarrhea chills.  Yesterday afternoon she had taken some ibuprofen 400 mg.  When she checked her temperature this morning it was 100.6.  She said she did sneeze a lot last night which was a little unusual.  As far she knows no known sick contacts with anyone with Covid or with vomiting or diarrhea.  She does work in a [employer] and wants to make sure that is okay for her to return to work   Past medical history, Surgical history, Family history not pertinant except as noted below, Social history, Allergies, and medications have been entered into the medical record, reviewed, and corrections made.   Review of Systems: No fevers, chills, night sweats, weight loss, chest pain, or shortness of breath.   Objective:    General: Speaking clearly in complete sentences without any shortness of breath.  Alert and oriented x3.  Normal judgment.  No apparent acute distress.    Impression and Recommendations:   Nausea/Vomiting-most consistent with acute viral gastroenteritis.  At this point she is feeling a little bit better.  Sent over prescription for Zofran to her pharmacy.  Recommend that she not return to work until Thursday.  She really needs to be fever free for at least 48 hours before returning.  So if at any point fever persist or she develops recurrence of symptoms or new onset symptoms then please let us know.  Time spent in encounter 20 minutes.   I discussed the assessment and treatment plan with the patient. The patient was provided an opportunity to ask questions and all were answered. The patient agreed with the plan and demonstrated an understanding of the instructions.   The patient was advised to call back or seek an in-person evaluation if the symptoms worsen or if the condition fails to improve as anticipated.   Beatrice Lecher, MD

## 2019-05-31 NOTE — Telephone Encounter (Signed)
Work note faxed.Joann Haynes, [place]

## 2019-06-01 NOTE — Telephone Encounter (Signed)
Called and informed pt that the 2nd fax # did not go thru either and advised that her letter is up front for p/u.Marland KitchenElouise Munroe, [place]

## 2019-11-08 ENCOUNTER — Other Ambulatory Visit: Payer: Self-pay

## 2019-11-08 MED ORDER — FLUOXETINE HCL 40 MG PO CAPS: 40.0000 mg | ORAL_CAPSULE | Freq: Every day | ORAL | 0 refills | Status: DC

## 2019-11-08 NOTE — Progress Notes (Signed)
Patient called for refills and I advised her that she would need an appointment before she could get further refills. She made an appointment.

## 2019-11-11 ENCOUNTER — Ambulatory Visit: Payer: PRIVATE HEALTH INSURANCE | Admitting: Family Medicine

## 2019-11-14 ENCOUNTER — Other Ambulatory Visit: Payer: Self-pay

## 2019-11-14 ENCOUNTER — Encounter: Payer: Self-pay | Admitting: Family Medicine

## 2019-11-14 ENCOUNTER — Ambulatory Visit (INDEPENDENT_AMBULATORY_CARE_PROVIDER_SITE_OTHER): Payer: No Typology Code available for payment source | Admitting: Family Medicine

## 2019-11-14 VITALS — BP 105/60 | HR 69 | Wt 122.0 lb

## 2019-11-14 DIAGNOSIS — Z13220 Encounter for screening for lipoid disorders: Secondary | ICD-10-CM

## 2019-11-14 DIAGNOSIS — N921 Excessive and frequent menstruation with irregular cycle: Secondary | ICD-10-CM | POA: Diagnosis not present

## 2019-11-14 DIAGNOSIS — Z1211 Encounter for screening for malignant neoplasm of colon: Secondary | ICD-10-CM | POA: Diagnosis not present

## 2019-11-14 DIAGNOSIS — F341 Dysthymic disorder: Secondary | ICD-10-CM | POA: Diagnosis not present

## 2019-11-14 DIAGNOSIS — R928 Other abnormal and inconclusive findings on diagnostic imaging of breast: Secondary | ICD-10-CM

## 2019-11-14 NOTE — Progress Notes (Signed)
Established Patient Office Visit  Subjective:  Patient ID: Joann Haynes, female    DOB: 12-29-1970  Age: 49 y.o. MRN: [id number]  CC:  Chief Complaint  Patient presents with  . Anxiety  . Depression    HPI Joann Haynes presents for follow-up of depression/anxiety-she has been on long-term maintenance therapy with fluoxetine 40 mg.  She reports that overall she feels like she is doing okay.  She always has a low level of depressive symptoms.  No thoughts of wanting to harm herself.  Sleep is actually okay right now not having any struggles with that.  Also her last mammogram was in 2016 she had calcifications in her right breast at that time and underwent biopsy but has never followed back up for routine evaluation.  She is also now 20 and has not had colon cancer screening.  Also she complains of spotting in between her periods she says mostly her periods have been regular every month lasting about 3 days are usually not very heavy but about 2 days in between her cycle she will spot and bleed this is been going on for a few months.  She denies any excessive cramps or pain.  Past Medical History:  Diagnosis Date  . Bed wetting 09/12/2010  . Depression     No past surgical history on file.  Family History  Problem Relation Age of Onset  . Hypertension Mother   . Cancer Mother   . Cancer Maternal Uncle   . Diabetes Maternal Grandfather     Social History   Socioeconomic History  . Marital status: Married    Spouse name: Not on file  . Number of children: Not on file  . Years of education: Not on file  . Highest education level: Not on file  Occupational History  . Not on file  Tobacco Use  . Smoking status: Never Smoker  . Smokeless tobacco: Never Used  Substance and Sexual Activity  . Alcohol use: No  . Drug use: No  . Sexual activity: Yes    Birth control/protection: None    Comment: married, o kids, 2 yrs college, works at [employer], regularly exercises, does  yoga/aerobic exercuse 3 times wk., 2 caffiene drinks per day.    Other Topics Concern  . Not on file  Social History Narrative  . Not on file   Social Determinants of Health   Financial Resource Strain:   . Difficulty of Paying Living Expenses:   Food Insecurity:   . Worried About Charity fundraiser in the Last Year:   . Arboriculturist in the Last Year:   Transportation Needs:   . Film/video editor (Medical):   Marland Kitchen Lack of Transportation (Non-Medical):   Physical Activity:   . Days of Exercise per Week:   . Minutes of Exercise per Session:   Stress:   . Feeling of Stress :   Social Connections:   . Frequency of Communication with Friends and Family:   . Frequency of Social Gatherings with Friends and Family:   . Attends Religious Services:   . Active Member of Clubs or Organizations:   . Attends Archivist Meetings:   Marland Kitchen Marital Status:   Intimate Partner Violence:   . Fear of Current or Ex-Partner:   . Emotionally Abused:   Marland Kitchen Physically Abused:   . Sexually Abused:     Outpatient Medications Prior to Visit  Medication Sig Dispense Refill  . FLUoxetine (PROZAC) 40 MG capsule  Take 1 capsule (40 mg total) by mouth daily. 30 capsule 0  . ondansetron (ZOFRAN ODT) 4 MG disintegrating tablet Take 1 tablet (4 mg total) by mouth every 8 (eight) hours as needed for nausea or vomiting. 12 tablet 0   No facility-administered medications prior to visit.    No Known Allergies  ROS Review of Systems    Objective:    Physical Exam Constitutional:      Appearance: She is well-developed.  HENT:     Head: Normocephalic and atraumatic.  Cardiovascular:     Rate and Rhythm: Normal rate and regular rhythm.     Heart sounds: Normal heart sounds.  Pulmonary:     Effort: Pulmonary effort is normal.     Breath sounds: Normal breath sounds.  Skin:    General: Skin is warm and dry.  Neurological:     Mental Status: She is alert and oriented to person, place, and  time.  Psychiatric:        Behavior: Behavior normal.     BP (!) 105/60   Pulse 69   Wt 122 lb (55.3 kg)   SpO2 100%   BMI 20.30 kg/m  Wt Readings from Last 3 Encounters:  11/14/19 122 lb (55.3 kg)  05/31/19 117 lb (53.1 kg)  07/29/18 117 lb (53.1 kg)     Health Maintenance Due  Topic Date Due  . Hepatitis C Screening  Never done  . HIV Screening  Never done  . MAMMOGRAM  08/03/2019  . COLONOSCOPY  Never done    There are no preventive care reminders to display for this patient.  Lab Results  Component Value Date   TSH 2.39 04/15/2017   Lab Results  Component Value Date   WBC 6.9 04/15/2017   HGB 12.2 04/15/2017   HCT 35.1 04/15/2017   MCV 89.1 04/15/2017   PLT 276 04/15/2017   Lab Results  Component Value Date   NA 139 07/12/2010   K 4.3 07/12/2010   CO2 25 07/12/2010   GLUCOSE 82 07/12/2010   BUN 12 07/12/2010   CREATININE 0.77 07/12/2010   BILITOT 0.7 07/12/2010   ALKPHOS 64 07/12/2010   AST 17 07/12/2010   ALT 13 07/12/2010   PROT 7.4 07/12/2010   ALBUMIN 4.7 07/12/2010   CALCIUM 9.5 07/12/2010   Lab Results  Component Value Date   CHOL 136 07/12/2010   Lab Results  Component Value Date   HDL 54 07/12/2010   Lab Results  Component Value Date   LDLCALC 72 07/12/2010   Lab Results  Component Value Date   TRIG 52 07/12/2010   Lab Results  Component Value Date   CHOLHDL 2.5 07/12/2010   No results found for: HGBA1C    Assessment & Plan:   Problem List Items Addressed This Visit      Other   ANXIETY DEPRESSION - Primary    She is actually quite happy with her current regimen and does not want to make any changes or adjustments.  She has been on this regimen for years.  We will continue fluoxetine 40 mg daily.      Relevant Orders   COMPLETE METABOLIC PANEL WITH GFR   Lipid panel   CBC    Other Visit Diagnoses    Lipid screening       Relevant Orders   Lipid panel   Metrorrhagia       Relevant Orders   CBC   TSH    Estradiol  Follicle stimulating hormone   Luteinizing hormone   Progesterone   US Pelvic Complete With Transvaginal   Colon cancer screening       Relevant Orders   Ambulatory referral to Gastroenterology   Abnormal screening mammogram       Relevant Orders   MM 3D SCREEN BREAST BILATERAL      Discussed colon cancer screening now that she is 28 discussed and reviewed options.  She would like to move forward with full colonoscopy so referral placed.  Discussed that she is also well overdue for screening mammogram in fact her last one was in 2016 and at that time she had a biopsy done for calcifications in the right breast.  She is due for repeat imaging.  Placed order today and we will get that scheduled in [place].  Reminded her of the importance of having this done.  Metrorrhagia-having intermenstrual short periods.  Her Pap smear is up-to-date in 2018.  She had negative HPV testing at that time.  She is married.  Discussed getting a pelvic ultrasound for further work-up to look at the thickness of the lining.  Consider additional work-up including STD testing etc.  No orders of the defined types were placed in this encounter.   Follow-up: Return in about 1 year (around 11/13/2020) for Mood medication .    Beatrice Lecher, MD

## 2019-11-14 NOTE — Assessment & Plan Note (Signed)
She is actually quite happy with her current regimen and does not want to make any changes or adjustments.  She has been on this regimen for years.  We will continue fluoxetine 40 mg daily.

## 2019-11-18 ENCOUNTER — Telehealth: Payer: Self-pay | Admitting: Family Medicine

## 2019-11-18 DIAGNOSIS — R928 Other abnormal and inconclusive findings on diagnostic imaging of breast: Secondary | ICD-10-CM

## 2019-11-18 NOTE — Telephone Encounter (Signed)
diag mammo orderd.

## 2019-11-21 ENCOUNTER — Ambulatory Visit (INDEPENDENT_AMBULATORY_CARE_PROVIDER_SITE_OTHER): Payer: No Typology Code available for payment source

## 2019-11-21 ENCOUNTER — Encounter: Payer: Self-pay | Admitting: Gastroenterology

## 2019-11-21 ENCOUNTER — Other Ambulatory Visit: Payer: Self-pay

## 2019-11-21 DIAGNOSIS — N921 Excessive and frequent menstruation with irregular cycle: Secondary | ICD-10-CM | POA: Diagnosis not present

## 2019-11-22 ENCOUNTER — Telehealth: Payer: Self-pay

## 2019-11-22 DIAGNOSIS — N921 Excessive and frequent menstruation with irregular cycle: Secondary | ICD-10-CM

## 2019-11-22 NOTE — Telephone Encounter (Signed)
Joann Haynes decided ok for the referral to GYN, down the hall. Pended the referral.

## 2019-12-02 ENCOUNTER — Other Ambulatory Visit: Payer: Self-pay

## 2019-12-02 ENCOUNTER — Ambulatory Visit (AMBULATORY_SURGERY_CENTER): Payer: Self-pay

## 2019-12-02 VITALS — Ht 66.0 in | Wt 124.0 lb

## 2019-12-02 DIAGNOSIS — Z1211 Encounter for screening for malignant neoplasm of colon: Secondary | ICD-10-CM

## 2019-12-02 MED ORDER — SUTAB 1479-225-188 MG PO TABS: 1.0000 | ORAL_TABLET | ORAL | 0 refills | Status: DC

## 2019-12-02 NOTE — Progress Notes (Signed)
No egg or soy allergy known to patient  No issues with past sedation with any surgeries or procedures---no previous surgeries No intubation problems in the past ---no past surgeries No FH of Malignant Hyperthermia No diet pills per patient No home 02 use per patient  No blood thinners per patient  Pt denies issues with constipation  No A fib or A flutter  EMMI video to pt or via [place] 19 guidelines implemented in [place] today with Pt and RN  Coupon given to pt in [facility] today , Code to Pharmacy   COVID vaccines completed on 08/2019 per pt;  Due to the COVID-19 pandemic we are asking patients to follow these guidelines. Please only bring one care partner. Please be aware that your care partner may wait in the car in the parking lot or if they feel like they will be too hot to wait in the car, they may wait in the lobby on the 4th floor. All care partners are required to wear a mask the entire time (we do not have any that we can provide them), they need to practice social distancing, and we will do a Covid check for all patient's and care partners when you arrive. Also we will check their temperature and your temperature. If the care partner waits in their car they need to stay in the parking lot the entire time and we will call them on their cell phone when the patient is ready for discharge so they can bring the car to the front of the building. Also all patient's will need to wear a mask into building.

## 2019-12-12 ENCOUNTER — Other Ambulatory Visit: Payer: Self-pay | Admitting: Family Medicine

## 2019-12-12 DIAGNOSIS — R921 Mammographic calcification found on diagnostic imaging of breast: Secondary | ICD-10-CM

## 2019-12-14 ENCOUNTER — Other Ambulatory Visit: Payer: Self-pay | Admitting: Family Medicine

## 2019-12-14 ENCOUNTER — Ambulatory Visit
Admission: RE | Admit: 2019-12-14 | Discharge: 2019-12-14 | Disposition: A | Discharge status: Home / Self Care | Payer: No Typology Code available for payment source | Source: Ambulatory Visit | Attending: Family Medicine | Admitting: Family Medicine

## 2019-12-14 ENCOUNTER — Other Ambulatory Visit: Payer: Self-pay

## 2019-12-14 DIAGNOSIS — R921 Mammographic calcification found on diagnostic imaging of breast: Secondary | ICD-10-CM

## 2019-12-15 ENCOUNTER — Encounter: Payer: Self-pay | Admitting: Obstetrics and Gynecology

## 2019-12-15 ENCOUNTER — Other Ambulatory Visit (HOSPITAL_COMMUNITY)
Admission: RE | Admit: 2019-12-15 | Discharge: 2019-12-15 | Disposition: A | Discharge status: Home / Self Care | Payer: No Typology Code available for payment source | Source: Ambulatory Visit | Attending: Obstetrics and Gynecology | Admitting: Obstetrics and Gynecology

## 2019-12-15 ENCOUNTER — Ambulatory Visit (INDEPENDENT_AMBULATORY_CARE_PROVIDER_SITE_OTHER): Payer: No Typology Code available for payment source | Admitting: Obstetrics and Gynecology

## 2019-12-15 VITALS — BP 100/59 | HR 67 | Resp 16 | Ht 66.0 in | Wt 118.0 lb

## 2019-12-15 DIAGNOSIS — Z01419 Encounter for gynecological examination (general) (routine) without abnormal findings: Secondary | ICD-10-CM | POA: Diagnosis present

## 2019-12-15 DIAGNOSIS — N939 Abnormal uterine and vaginal bleeding, unspecified: Secondary | ICD-10-CM

## 2019-12-15 NOTE — Progress Notes (Signed)
Having Colonoscopy tomorrow

## 2019-12-15 NOTE — Progress Notes (Signed)
Subjective:     Joann Haynes is a 49 y.o. female with LMP 11/14/19 with BMI 19 who is here for a comprehensive physical exam. The patient reports bleeding in between menses. She reports a monthly 5-day period with occasional vaginal bleeding in between. Bleeding varies from a few days of spotting to a full day of vaginal bleeding requiring a change in pad. Patient denies any pelvic pain. She is sexually active without complaints.   Past Medical History:  Diagnosis Date  . Bed wetting 09/12/2010  . Depression    Past Surgical History:  Procedure Laterality Date  . NO PAST SURGERIES     Family History  Problem Relation Age of Onset  . Hypertension Mother   . Breast cancer Mother 58  . Colon cancer Maternal Uncle 40  . Colon polyps Maternal Uncle 17  . Diabetes Maternal Grandfather   . Esophageal cancer Neg Hx   . Rectal cancer Neg Hx   . Stomach cancer Neg Hx     Social History   Socioeconomic History  . Marital status: Married    Spouse name: Not on file  . Number of children: Not on file  . Years of education: Not on file  . Highest education level: Not on file  Occupational History  . Not on file  Tobacco Use  . Smoking status: Never Smoker  . Smokeless tobacco: Never Used  Vaping Use  . Vaping Use: Never used  Substance and Sexual Activity  . Alcohol use: No  . Drug use: No  . Sexual activity: Yes    Birth control/protection: None    Comment: married, o kids, 2 yrs college, works at [employer], regularly exercises, does yoga/aerobic exercuse 3 times wk., 2 caffiene drinks per day.    Other Topics Concern  . Not on file  Social History Narrative  . Not on file   Social Determinants of Health   Financial Resource Strain:   . Difficulty of Paying Living Expenses: Not on file  Food Insecurity:   . Worried About Charity fundraiser in the Last Year: Not on file  . Ran Out of Food in the Last Year: Not on file  Transportation Needs:   . Lack of Transportation  (Medical): Not on file  . Lack of Transportation (Non-Medical): Not on file  Physical Activity:   . Days of Exercise per Week: Not on file  . Minutes of Exercise per Session: Not on file  Stress:   . Feeling of Stress : Not on file  Social Connections:   . Frequency of Communication with Friends and Family: Not on file  . Frequency of Social Gatherings with Friends and Family: Not on file  . Attends Religious Services: Not on file  . Active Member of Clubs or Organizations: Not on file  . Attends Archivist Meetings: Not on file  . Marital Status: Not on file  Intimate Partner Violence:   . Fear of Current or Ex-Partner: Not on file  . Emotionally Abused: Not on file  . Physically Abused: Not on file  . Sexually Abused: Not on file   Health Maintenance  Topic Date Due  . Hepatitis C Screening  Never done  . HIV Screening  Never done  . COLONOSCOPY  Never done  . INFLUENZA VACCINE  11/20/2019  . TETANUS/TDAP  02/09/2021  . MAMMOGRAM  12/13/2021  . PAP SMEAR-Modifier  04/15/2022  . COVID-19 Vaccine  Completed       Review of  Systems Pertinent items noted in HPI and remainder of comprehensive ROS otherwise negative.   Objective:  Blood pressure (!) 100/59, pulse 67, resp. rate 16, height 5\' 6"  (1.676 m), weight 118 lb (53.5 kg), last menstrual period 11/14/2019.     GENERAL: Well-developed, well-nourished female in no acute distress.  HEENT: Normocephalic, atraumatic. Sclerae anicteric.  NECK: Supple. Normal thyroid.  LUNGS: Clear to auscultation bilaterally.  HEART: Regular rate and rhythm. BREASTS: Symmetric in size. No palpable masses or lymphadenopathy, skin changes, or nipple drainage. ABDOMEN: Soft, nontender, nondistended. No organomegaly. PELVIC: Normal external female genitalia. Vagina is pink and rugated.  Normal discharge. Normal appearing cervix. Uterus is normal in size. No adnexal mass or tenderness. EXTREMITIES: No cyanosis, clubbing, or edema, 2+  distal pulses.   11/2019 ultrasound FINDINGS: Uterus  Measurements: 8.0 x 2.6 x 4.5 cm = volume: 49 mL. Anteverted. Normal morphology without mass  Endometrium  Thickness: 8 mm. Slightly more prominent/heterogeneous at the upper where a questionable endometrial/endocervical polyp is identified 1.0 x 1.8 x 3.3 cm.  Right ovary  Measurements: 4.5 x 2.0 x 3.1 cm = volume: 15 mL. Dominant follicle 2.3 cm diameter; no follow-up imaging required. No additional masses.  Left ovary  Measurements: 3.5 x 1.3 x 2.1 cm = volume: 5 mL. Normal morphology without mass  Other findings  No free pelvic fluid or adnexal masses.  IMPRESSION: Abnormal heterogeneous thickening of the endometrial complex/endocervical canal at the lower uterine segment cervix junction, 1.0 x 1.8 x 3.3 cm in size, cannot exclude endometrial/endocervical polyp; consider further evaluation with sonohysterogram for confirmation prior to hysteroscopy. Endometrial sampling should also be considered if patient is at high risk for endometrial carcinoma. (Ref: Radiological Reasoning: Algorithmic Workup of Abnormal Vaginal Bleeding with Endovaginal Sonography and Sonohysterography. AJR 2008; 333:O32-91)  Remainder of exam unremarkable.   Electronically Signed   By: Lavonia Dana M.D.   On: 11/21/2019 16:42 Assessment:    Healthy female exam.      Plan:    Pap smear collected Reviewed ultrasound findings with the patient and discussed benefits of D&C hysteroscopy with polypectomy. Risks, benefits and alternatives were explained including but not limited to risks of bleeding, infection, uterine perforation and damage to adjacent organs.  Patient verbalize understanding and all questions were answered Patient will be scheduled for [facility] hysteroscopy with polypectomy See After Visit Summary for Counseling Recommendations

## 2019-12-16 ENCOUNTER — Telehealth: Payer: Self-pay | Admitting: Gastroenterology

## 2019-12-16 ENCOUNTER — Encounter: Payer: Self-pay | Admitting: Gastroenterology

## 2019-12-16 ENCOUNTER — Ambulatory Visit (AMBULATORY_SURGERY_CENTER): Payer: No Typology Code available for payment source | Admitting: Gastroenterology

## 2019-12-16 ENCOUNTER — Other Ambulatory Visit: Payer: Self-pay

## 2019-12-16 VITALS — BP 118/63 | HR 66 | Temp 98.2°F | Resp 10 | Ht 66.0 in | Wt 124.0 lb

## 2019-12-16 DIAGNOSIS — D123 Benign neoplasm of transverse colon: Secondary | ICD-10-CM

## 2019-12-16 DIAGNOSIS — Z1211 Encounter for screening for malignant neoplasm of colon: Secondary | ICD-10-CM | POA: Diagnosis not present

## 2019-12-16 MED ORDER — SODIUM CHLORIDE 0.9 % IV SOLN: 500.0000 mL | Freq: Once | INTRAVENOUS | Status: DC

## 2019-12-16 NOTE — Telephone Encounter (Signed)
Thank you Joelene Millin, she did fine.

## 2019-12-16 NOTE — Progress Notes (Signed)
Pt's states no medical or surgical changes since previsit or office visit.  CW - vitals 

## 2019-12-16 NOTE — Progress Notes (Signed)
pt tolerated well. VSS. awake and to recovery. Report given to RN.  

## 2019-12-16 NOTE — Patient Instructions (Signed)
Handouts given;  Polyps, Hemorrhoids Resume previous diet Continue present medications Await pathology results  YOU HAD AN ENDOSCOPIC PROCEDURE TODAY AT THE [facility]:   Refer to the procedure report that was given to you for any specific questions about what was found during the examination.  If the procedure report does not answer your questions, please call your gastroenterologist to clarify.  If you requested that your care partner not be given the details of your procedure findings, then the procedure report has been included in a sealed envelope for you to review at your convenience later.  YOU SHOULD EXPECT: Some feelings of bloating in the abdomen. Passage of more gas than usual.  Walking can help get rid of the air that was put into your GI tract during the procedure and reduce the bloating. If you had a lower endoscopy (such as a colonoscopy or flexible sigmoidoscopy) you may notice spotting of blood in your stool or on the toilet paper. If you underwent a bowel prep for your procedure, you may not have a normal bowel movement for a few days.  Please Note:  You might notice some irritation and congestion in your nose or some drainage.  This is from the oxygen used during your procedure.  There is no need for concern and it should clear up in a day or so.  SYMPTOMS TO REPORT IMMEDIATELY:   Following lower endoscopy (colonoscopy or flexible sigmoidoscopy):  Excessive amounts of blood in the stool  Significant tenderness or worsening of abdominal pains  Swelling of the abdomen that is new, acute  Fever of 100F or higher For urgent or emergent issues, a gastroenterologist can be reached at any hour by calling [phone]. Do not use MyChart messaging for urgent concerns.   DIET:  We do recommend a small meal at first, but then you may proceed to your regular diet.  Drink plenty of fluids but you should avoid alcoholic beverages for 24 hours.  ACTIVITY:  You should  plan to take it easy for the rest of today and you should NOT DRIVE or use heavy machinery until tomorrow (because of the sedation medicines used during the test).    FOLLOW UP: Our staff will call the number listed on your records 48-72 hours following your procedure to check on you and address any questions or concerns that you may have regarding the information given to you following your procedure. If we do not reach you, we will leave a message.  We will attempt to reach you two times.  During this call, we will ask if you have developed any symptoms of COVID 19. If you develop any symptoms (ie: fever, flu-like symptoms, shortness of breath, cough etc.) before then, please call [phone].  If you test positive for Covid 19 in the 2 weeks post procedure, please call and report this information to us.    If any biopsies were taken you will be contacted by phone or by letter within the next 1-3 weeks.  Please call us at [phone] if you have not heard about the biopsies in 3 weeks.   SIGNATURES/CONFIDENTIALITY: You and/or your care partner have signed paperwork which will be entered into your electronic medical record.  These signatures attest to the fact that that the information above on your After Visit Summary has been reviewed and is understood.  Full responsibility of the confidentiality of this discharge information lies with you and/or your care-partner. 

## 2019-12-16 NOTE — Telephone Encounter (Signed)
Great news. Thanks for the follow-up.  

## 2019-12-16 NOTE — Progress Notes (Signed)
Called to room to assist during endoscopic procedure.  Patient ID and intended procedure confirmed with present staff. Received instructions for my participation in the procedure from the performing physician.  

## 2019-12-16 NOTE — Op Note (Signed)
[facility] Patient Name: Joann Haynes Procedure Date: 12/16/2019 9:36 AM MRN: [id number] Endoscopist: Mauri Pole , MD Age: 49 Referring MD:  Date of Birth: Apr 08, 1971 Gender: Female Account #: [id number] Procedure:                Colonoscopy Indications:              Screening for colorectal malignant neoplasm Medicines:                Monitored Anesthesia Care Procedure:                Pre-Anesthesia Assessment:                           - Prior to the procedure, a History and Physical                            was performed, and patient medications and                            allergies were reviewed. The patient's tolerance of                            previous anesthesia was also reviewed. The risks                            and benefits of the procedure and the sedation                            options and risks were discussed with the patient.                            All questions were answered, and informed consent                            was obtained. Prior Anticoagulants: The patient has                            taken no previous anticoagulant or antiplatelet                            agents. ASA Grade Assessment: II - A patient with                            mild systemic disease. After reviewing the risks                            and benefits, the patient was deemed in                            satisfactory condition to undergo the procedure.                           After obtaining informed consent, the colonoscope  was passed under direct vision. Throughout the                            procedure, the patient's blood pressure, pulse, and                            oxygen saturations were monitored continuously. The                            Colonoscope was introduced through the anus and                            advanced to the the cecum, identified by                            appendiceal orifice and  ileocecal valve. The                            colonoscopy was performed without difficulty. The                            patient tolerated the procedure well. The quality                            of the bowel preparation was adequate. The                            ileocecal valve, appendiceal orifice, and rectum                            were photographed. Scope In: 9:47:16 AM Scope Out: 10:10:13 AM Scope Withdrawal Time: 0 hours 10 minutes 37 seconds  Total Procedure Duration: 0 hours 22 minutes 57 seconds  Findings:                 The perianal and digital rectal examinations were                            normal.                           Two sessile polyps were found in the transverse                            colon. The polyps were 5 to 7 mm in size. These                            polyps were removed with a cold biopsy forceps.                            Resection and retrieval were complete.                           Non-bleeding internal hemorrhoids were found during  retroflexion. The hemorrhoids were small. Complications:            No immediate complications. Estimated Blood Loss:     Estimated blood loss was minimal. Impression:               - Two 5 to 7 mm polyps in the transverse colon,                            removed with a cold biopsy forceps. Resected and                            retrieved.                           - Non-bleeding internal hemorrhoids. Recommendation:           - Patient has a contact number available for                            emergencies. The signs and symptoms of potential                            delayed complications were discussed with the                            patient. Return to normal activities tomorrow.                            Written discharge instructions were provided to the                            patient.                           - Resume previous diet.                            - Continue present medications.                           - Await pathology results.                           - Repeat colonoscopy in 5-10 years for surveillance                            based on pathology results. Mauri Pole, MD 12/16/2019 10:14:57 AM This report has been signed electronically.

## 2019-12-16 NOTE — Telephone Encounter (Signed)
[facility] MD: Nand[place]  Patient called at 7:30 with nausea and inability to complete the last 24 ounces of water with her bowel prep this morning. Stool sounds like diarrhea with flecks of brown. I asked her to drink one more glass of water. She will come in early for her appointment for possible enema.

## 2019-12-19 LAB — CYTOLOGY - PAP
Comment: NEGATIVE
Diagnosis: NEGATIVE
High risk HPV: NEGATIVE

## 2019-12-20 ENCOUNTER — Telehealth: Payer: Self-pay | Admitting: *Deleted

## 2019-12-20 ENCOUNTER — Telehealth: Payer: Self-pay

## 2019-12-20 NOTE — Telephone Encounter (Signed)
2nd follow up call made.  NALM 

## 2019-12-20 NOTE — Telephone Encounter (Signed)
  Follow up Call-  Call back number 12/16/2019  Post procedure Call Back phone  # [phone]  Permission to leave phone message Yes  Some recent data might be hidden     Patient questions:  Message left to call if necessary.

## 2019-12-23 ENCOUNTER — Ambulatory Visit
Admission: RE | Admit: 2019-12-23 | Discharge: 2019-12-23 | Disposition: A | Discharge status: Home / Self Care | Payer: No Typology Code available for payment source | Source: Ambulatory Visit | Attending: Family Medicine | Admitting: Family Medicine

## 2019-12-23 ENCOUNTER — Other Ambulatory Visit: Payer: Self-pay | Admitting: Family Medicine

## 2019-12-23 ENCOUNTER — Other Ambulatory Visit: Payer: Self-pay

## 2019-12-23 DIAGNOSIS — R921 Mammographic calcification found on diagnostic imaging of breast: Secondary | ICD-10-CM

## 2019-12-28 ENCOUNTER — Encounter (HOSPITAL_BASED_OUTPATIENT_CLINIC_OR_DEPARTMENT_OTHER): Payer: Self-pay | Admitting: Obstetrics and Gynecology

## 2019-12-29 ENCOUNTER — Other Ambulatory Visit: Payer: Self-pay

## 2019-12-29 ENCOUNTER — Encounter: Payer: Self-pay | Admitting: Gastroenterology

## 2019-12-29 ENCOUNTER — Encounter (HOSPITAL_BASED_OUTPATIENT_CLINIC_OR_DEPARTMENT_OTHER): Payer: Self-pay | Admitting: Obstetrics and Gynecology

## 2019-12-30 ENCOUNTER — Other Ambulatory Visit (HOSPITAL_COMMUNITY): Payer: No Typology Code available for payment source

## 2019-12-30 ENCOUNTER — Encounter (HOSPITAL_BASED_OUTPATIENT_CLINIC_OR_DEPARTMENT_OTHER)
Admission: RE | Admit: 2019-12-30 | Discharge: 2019-12-30 | Disposition: A | Discharge status: Home / Self Care | Payer: No Typology Code available for payment source | Source: Ambulatory Visit | Attending: Obstetrics and Gynecology | Admitting: Obstetrics and Gynecology

## 2019-12-30 ENCOUNTER — Other Ambulatory Visit (HOSPITAL_COMMUNITY)
Admission: RE | Admit: 2019-12-30 | Discharge: 2019-12-30 | Disposition: A | Discharge status: Home / Self Care | Payer: No Typology Code available for payment source | Source: Ambulatory Visit | Attending: Obstetrics and Gynecology | Admitting: Obstetrics and Gynecology

## 2019-12-30 DIAGNOSIS — Z20822 Contact with and (suspected) exposure to covid-19: Secondary | ICD-10-CM | POA: Diagnosis not present

## 2019-12-30 DIAGNOSIS — Z01812 Encounter for preprocedural laboratory examination: Secondary | ICD-10-CM | POA: Insufficient documentation

## 2019-12-30 DIAGNOSIS — Z3202 Encounter for pregnancy test, result negative: Secondary | ICD-10-CM | POA: Insufficient documentation

## 2019-12-30 LAB — SARS CORONAVIRUS 2 (TAT 6-24 HRS): SARS Coronavirus 2: NEGATIVE

## 2019-12-30 LAB — POCT PREGNANCY, URINE: Preg Test, Ur: NEGATIVE

## 2019-12-30 NOTE — Progress Notes (Signed)

## 2020-01-02 NOTE — Progress Notes (Signed)
Patient called the office with questions regarding up coming surgery. Patient is very nervous about having surgery.  Indication for surgery reviewed with the patient. Patient with AUB with suspected endometrial polyp. Patient reports bleeding stopped. Discussed 1-2% risks of endometrial carcinoma associated with polyps in pre-menopausal women and a 5-6% endometrial carcinoma associated in postmenopausal women. Patient verbalized understanding. She opted to continue monitoring AUB and plans to follow up in the office. All questions were answered

## 2020-01-03 ENCOUNTER — Ambulatory Visit (HOSPITAL_BASED_OUTPATIENT_CLINIC_OR_DEPARTMENT_OTHER)
Admission: RE | Admit: 2020-01-03 | Payer: No Typology Code available for payment source | Source: Home / Self Care | Admitting: Obstetrics and Gynecology

## 2020-01-03 HISTORY — DX: Polyp of corpus uteri: N84.0

## 2020-01-03 SURGERY — DILATATION AND CURETTAGE /HYSTEROSCOPY
Anesthesia: Choice

## 2020-04-04 ENCOUNTER — Encounter: Payer: Self-pay | Admitting: Family Medicine

## 2020-04-04 ENCOUNTER — Telehealth (INDEPENDENT_AMBULATORY_CARE_PROVIDER_SITE_OTHER): Payer: No Typology Code available for payment source | Admitting: Family Medicine

## 2020-04-04 VITALS — Temp 97.5°F | Wt 118.0 lb

## 2020-04-04 DIAGNOSIS — Z20822 Contact with and (suspected) exposure to covid-19: Secondary | ICD-10-CM | POA: Diagnosis not present

## 2020-04-04 DIAGNOSIS — R112 Nausea with vomiting, unspecified: Secondary | ICD-10-CM | POA: Diagnosis not present

## 2020-04-04 MED ORDER — ONDANSETRON 4 MG PO TBDP: 4.0000 mg | ORAL_TABLET | Freq: Three times a day (TID) | ORAL | 0 refills | Status: DC | PRN

## 2020-04-04 NOTE — Progress Notes (Signed)
Symptoms started this morning around 830am.  Symptoms: Vomiting Headache  Not taking any medication to help symptoms.

## 2020-04-04 NOTE — Assessment & Plan Note (Signed)
Likely viral in etiology. I think COVID is less likely but needs testing for work.  Recommend increased fluid and bland diet.  Rx for zofran sent in.  Instructed to contact clinic if zofran is not helpful or symptoms worsen or change.

## 2020-04-04 NOTE — Progress Notes (Signed)
Joann Haynes - 49 y.o. female MRN [id number]  Date of birth: 03/24/1971   This visit type was conducted due to national recommendations for restrictions regarding the COVID-19 Pandemic (e.g. social distancing).  This format is felt to be most appropriate for this patient at this time.  All issues noted in this document were discussed and addressed.  No physical exam was performed (except for noted visual exam findings with Video Visits).  I discussed the limitations of evaluation and management by telemedicine and the availability of in person appointments. The patient expressed understanding and agreed to proceed.  I connected with@ on 04/04/20 at  1:00 PM EST by a video enabled telemedicine application and verified that I am speaking with the correct person using two identifiers.  Interactive audio and video telecommunications were attempted between this provider and patient, however failed, due to patient having technical difficulties OR patient did not have access to video capability.  We continued and completed visit with audio only.   Present at visit: Luetta Nutting, DO Estevan Ryder   Patient Location: Home [address] [place] [address]   Provider location:   [place]  No chief complaint on file.   HPI  Joann Haynes is a 49 y.o. female who presents via audio/video conferencing for a telehealth visit today.  She has complaint of nausea with vomiting and headache.  Vomit is normal stomach contents, non bloody/non bilious.  Symptoms started this morning.  She denies abdominal pain, diarrhea, fever, chills, body aches.  She is drinking fluids but has decreased appetite.  She needs to be COVID tested before she will be allowed to return to work.     ROS:  A comprehensive ROS was completed and negative except as noted per HPI  Past Medical History:  Diagnosis Date  . Bed wetting 09/12/2010  . Depression   . Uterine polyp     Past Surgical History:  Procedure Laterality Date  .  COLONOSCOPY      Family History  Problem Relation Age of Onset  . Hypertension Mother   . Breast cancer Mother 26  . Colon cancer Maternal Uncle 7  . Colon polyps Maternal Uncle 88  . Diabetes Maternal Grandfather   . Esophageal cancer Neg Hx   . Rectal cancer Neg Hx   . Stomach cancer Neg Hx     Social History   Socioeconomic History  . Marital status: Married    Spouse name: Not on file  . Number of children: Not on file  . Years of education: Not on file  . Highest education level: Not on file  Occupational History  . Not on file  Tobacco Use  . Smoking status: Never Smoker  . Smokeless tobacco: Never Used  Vaping Use  . Vaping Use: Never used  Substance and Sexual Activity  . Alcohol use: Yes    Comment: social  . Drug use: No  . Sexual activity: Yes    Birth control/protection: None    Comment: married, o kids, 2 yrs college, works at [employer], regularly exercises, does yoga/aerobic exercuse 3 times wk., 2 caffiene drinks per day.    Other Topics Concern  . Not on file  Social History Narrative  . Not on file   Social Determinants of Health   Financial Resource Strain: Not on file  Food Insecurity: Not on file  Transportation Needs: Not on file  Physical Activity: Not on file  Stress: Not on file  Social Connections: Not on file  Intimate Partner Violence: Not on file     Current Outpatient Medications:  .  FLUoxetine (PROZAC) 40 MG capsule, Take 1 capsule by mouth once daily, Disp: 90 capsule, Rfl: 1 .  PREVIDENT 5000 BOOSTER PLUS 1.1 % PSTE, Place onto teeth., Disp: , Rfl:  .  ondansetron (ZOFRAN ODT) 4 MG disintegrating tablet, Take 1 tablet (4 mg total) by mouth every 8 (eight) hours as needed for nausea or vomiting., Disp: 20 tablet, Rfl: 0  EXAM:  VITALS per patient if applicable: Temp (!) 09.9 F (36.4 C)   Wt 118 lb (53.5 kg)   BMI 19.05 kg/m   GENERAL: alert, oriented, appears well and in no acute distress  LUNGS:  no signs of  respiratory distress, breathing rate appears normal, no obvious gross SOB, gasping or wheezing  PSYCH/NEURO: pleasant and cooperative, no obvious depression or anxiety, speech and thought processing grossly intact  ASSESSMENT AND PLAN:  Discussed the following assessment and plan:  Nausea and vomiting Likely viral in etiology. I think COVID is less likely but needs testing for work.  Recommend increased fluid and bland diet.  Rx for zofran sent in.  Instructed to contact clinic if zofran is not helpful or symptoms worsen or change.    25 minutes spent including pre visit preparation, review of prior notes and labs, encounter with patient via telephone visit and same day documentation.     I discussed the assessment and treatment plan with the patient. The patient was provided an opportunity to ask questions and all were answered. The patient agreed with the plan and demonstrated an understanding of the instructions.   The patient was advised to call back or seek an in-person evaluation if the symptoms worsen or if the condition fails to improve as anticipated.    Luetta Nutting, DO

## 2020-04-06 LAB — NOVEL CORONAVIRUS, NAA: SARS-CoV-2, NAA: NOT DETECTED

## 2020-04-06 LAB — SARS-COV-2, NAA 2 DAY TAT

## 2020-04-24 ENCOUNTER — Telehealth (INDEPENDENT_AMBULATORY_CARE_PROVIDER_SITE_OTHER): Payer: No Typology Code available for payment source | Admitting: Nurse Practitioner

## 2020-04-24 ENCOUNTER — Other Ambulatory Visit: Payer: Self-pay

## 2020-04-24 ENCOUNTER — Encounter: Payer: Self-pay | Admitting: Nurse Practitioner

## 2020-04-24 VITALS — Temp 99.1°F

## 2020-04-24 DIAGNOSIS — R509 Fever, unspecified: Secondary | ICD-10-CM

## 2020-04-24 DIAGNOSIS — R059 Cough, unspecified: Secondary | ICD-10-CM

## 2020-04-24 DIAGNOSIS — R52 Pain, unspecified: Secondary | ICD-10-CM | POA: Diagnosis not present

## 2020-04-24 LAB — POCT INFLUENZA A/B
Influenza A, POC: NEGATIVE
Influenza B, POC: NEGATIVE

## 2020-04-24 MED ORDER — OSELTAMIVIR PHOSPHATE 75 MG PO CAPS: 75.0000 mg | ORAL_CAPSULE | Freq: Two times a day (BID) | ORAL | 0 refills | Status: DC

## 2020-04-24 MED ORDER — BENZONATATE 200 MG PO CAPS: 200.0000 mg | ORAL_CAPSULE | Freq: Three times a day (TID) | ORAL | 0 refills | Status: DC | PRN

## 2020-04-24 NOTE — Progress Notes (Signed)
Virtual Video Visit via MyChart Note  I connected with  Joann Haynes on 04/24/20 at  1:30 PM EST by the video enabled telemedicine application for , MyChart, and verified that I am speaking with the correct person using two identifiers.   I introduced myself as a Publishing rights manager with the practice. We discussed the limitations of evaluation and management by telemedicine and the availability of in person appointments. The patient expressed understanding and agreed to proceed.  Participating parties in this visit include: The patient and the nurse practitioner listed.  The patient is: At home I am: In the office  Subjective:    CC:  Chief Complaint  Patient presents with  . URI    Onset 3 days ago, chills, cough, BA, low grade fever, taking Thera-Flu and Tylenol with benefit    HPI: Joann Haynes is a 50 y.o. year old female presenting today via MyChart today for flu-like symptoms that have been ongoing for about the last 3 days. She endorses body aches, cough, chills, low grade fever, and rhinorrhea.  She denies sinus pain or pressure, loss of taste, loss of smell, shortness of breath, wheezing, or chest pain. She has no known sick contacts.   She has received the COVID vaccine, last dose in May of 2021- no booster at this time. She has not received the flu vaccine for this season  Past medical history, Surgical history, Family history not pertinant except as noted below, Social history, Allergies, and medications have been entered into the medical record, reviewed, and corrections made.   Review of Systems:  All review of systems negative except what is listed in the HPI  Objective:    General:  Speaking clearly in complete sentences. Absent shortness of breath noted.   Alert and oriented x3.   Normal judgment.  Absent acute distress. Impression and Recommendations:    1. Cough 2. Fever, unspecified fever cause 3. Generalized body aches Symptoms are consistent with viral  upper respiratory illness- most concerning for COVID-19 and Influenza. Given the time frame since symptom onset and starting treatment, we will start Tamiflu today. Jerilynn Som also sent to pharmacy. OTC treatment recommendations discussed with patient and provided on AVS printout.  Will plan for COVID and Flu swab to rule these out given the high community prevalence and current symptoms- to be performed today with this office.  Work note provided for patient.  Quarantine recommendations provided.   Follow-up if symptoms worsen or fail to improve.    I discussed the assessment and treatment plan with the patient. The patient was provided an opportunity to ask questions and all were answered. The patient agreed with the plan and demonstrated an understanding of the instructions.   The patient was advised to call back or seek an in-person evaluation if the symptoms worsen or if the condition fails to improve as anticipated.  I provided 20 minutes of non-face-to-face interaction with this MYCHART visit including intake, same-day documentation, and chart review.   Tollie Eth, NP

## 2020-04-24 NOTE — Patient Instructions (Signed)
Your symptoms are consistent with what we are seeing with both COVID and flu in our area at this time. I think it would be beneficial to have you swabbed for both of these to be able to determine what is causing your symptoms.   We can begin tamiflu today, as you are within the 72 hours of symptom onset recommended for start.   Other recommendations for medications that have been found to be helpful for the symptoms you are experiencing include the following:  Over the counter medications that may be helpful for symptoms: . Guaifenesin 1200 mg extended release tabs twice daily, with plenty of water o For cough and congestion o Brand name: Mucinex   . Pseudoephedrine 30 mg, one or two tabs every 4 to 6 hours o For sinus congestion o Brand name: Sudafed o You must get this from the pharmacy counter.  . Oxymetazoline nasal spray each morning, one spray in each nostril, for NO MORE THAN 3 days  o For nasal and sinus congestion o Brand name: Afrin . Saline nasal spray or Saline Nasal Irrigation 3-5 times a day o For nasal and sinus congestion o Brand names: Ocean or AYR . Fluticasone nasal spray, one spray in each nostril, each morning after oxymetazoline and saline, if used o For nasal and sinus congestion o Brand name: Flonase . Warm salt water gargles  o For sore throat o Every few hours as needed . Alternate ibuprofen 400-600 mg and acetaminophen 1000 mg every 4-6 hours o For fever, body aches, headache o Brand names: Motrin or Advil and Tylenol . Dextromethorphan 12-hour cough version 30 mg every 12 hours  o For cough o Brand name: Delsym Read all cold medications (Nyquil, Dayquil, Tylenol Cold, Theraflu, etc) and other non-prescription cough/cold preparations to ensure that you are not taking two of the same type of medication. Many of these have the same ingredients listed above and could cause an overdose of medication.   General Instructions . Allow your body to rest . Drink  PLENTY of fluids . Isolate yourself from everyone, even family, until test results have returned  If your COVID-19 test is positive . Then you ARE INFECTED and you can pass the virus to others . You must quarantine from others for a minimum of  o 10 days since symptoms started AND o You are fever free for 24 hours WITHOUT any medication to reduce fever AND o Your symptoms are improving . Do not go to the store or other public areas . Do not go around household members who are not known to be infected with COVID-19 . If you MUST leave you area of quarantine (example: go to a bathroom you share with others in your home), you must o Wear a mask o Wash your hands thoroughly o Wipe down any surfaces you touch . Do not share food, drinks, towels, or other items with other persons . Dispose of your own tissues, food containers, etc  Once you have recovered, please continue good preventive care measures, including:  . wearing a mask when in public . wash your hands frequently . avoid touching your face/nose/eyes . cover coughs/sneezes with the inside of your elbow . stay out of crowds . keep a 6 foot distance from others  Go to the nearest hospital emergency room if fever/cough/breathlessness are severe or illness seems like a threat to life.

## 2020-04-24 NOTE — Progress Notes (Signed)
Called and left VM for patient that Flu test was negative. Will await results of COVID testing.

## 2020-04-26 LAB — NOVEL CORONAVIRUS, NAA: SARS-CoV-2, NAA: DETECTED — AB

## 2020-04-26 LAB — SARS-COV-2, NAA 2 DAY TAT

## 2020-04-26 NOTE — Progress Notes (Signed)
Patient is positive for COVID-19. She will need to quarantine for a minimum of 10 days from symptom onset and until she is fever free and symptoms are improving. Monitor for signs of respiratory distress or shortness of breath at rest.   Mucinex, Tylenol, Ibuprofen, Flonase, and Sudafed may be helpful for symptom management.  Please let us know if work note is required.

## 2020-04-27 ENCOUNTER — Telehealth: Payer: Self-pay | Admitting: Internal Medicine

## 2020-04-27 NOTE — Telephone Encounter (Signed)
Called to discuss with patient about COVID-19 symptoms and the use of one of the available treatments for those with mild to moderate Covid symptoms and at a high risk of hospitalization.  Pt does not appear to qualify for outpatient treatment with unidentifiable co-morbid conditions and/or a member of an at-risk group in accordance with the FDA Emergency Use Authorization.    Symptom onset: per televisit note 04/21/20 Vaccinated: yes 2nd > 6 month per note  Booster? no Qualifiers: none identified   Unable to reach pt - Unable to reach pt's vm. I do not see any identifiable risk factors that would qualify this pt for outpt covid treatment. If able, team member may try to reach out later today to verify chart findings.   Alan Ripper, [place]

## 2020-05-01 ENCOUNTER — Telehealth: Payer: Self-pay

## 2020-05-01 NOTE — Telephone Encounter (Signed)
Based on the recommendations, she will be required to stay out of work until she has been fever free for more than 24 hours without the use of medication to lower her temperature.   Unfortunately, I am unable to predict when her fever might break. Would she like a note stating the guidelines or would she rather contact us when her fever has resolved and we can send the note at that time?

## 2020-05-01 NOTE — Telephone Encounter (Signed)
Kenza called and states she is due to return to work on Wednesday. She still has a fever. She is still not feeling well. Please advise for when to return to work. She will need an extended work note if she can not return on Wednesday.

## 2020-05-02 NOTE — Telephone Encounter (Signed)
Patient advised. She states she didn't have a true fever just 99.2.

## 2020-05-02 NOTE — Telephone Encounter (Signed)
Left message with recommendations.  

## 2020-05-24 ENCOUNTER — Other Ambulatory Visit: Payer: Self-pay | Admitting: Family Medicine

## 2020-07-16 ENCOUNTER — Other Ambulatory Visit: Payer: Self-pay | Admitting: Family Medicine

## 2020-07-16 DIAGNOSIS — R928 Other abnormal and inconclusive findings on diagnostic imaging of breast: Secondary | ICD-10-CM

## 2020-08-13 ENCOUNTER — Other Ambulatory Visit: Payer: No Typology Code available for payment source

## 2020-08-17 ENCOUNTER — Telehealth: Payer: Self-pay | Admitting: *Deleted

## 2020-08-17 NOTE — Telephone Encounter (Signed)
Pt called stating that she will need the results of her last mammogram and an order to be faxed to [facility] to have her mammogram done. She states that [facility] is in her network. [phone]

## 2020-09-14 ENCOUNTER — Other Ambulatory Visit: Payer: Self-pay | Admitting: *Deleted

## 2020-09-14 DIAGNOSIS — R928 Other abnormal and inconclusive findings on diagnostic imaging of breast: Secondary | ICD-10-CM

## 2020-09-28 ENCOUNTER — Telehealth: Payer: Self-pay | Admitting: Family Medicine

## 2020-09-28 DIAGNOSIS — R928 Other abnormal and inconclusive findings on diagnostic imaging of breast: Secondary | ICD-10-CM

## 2020-09-28 NOTE — Telephone Encounter (Signed)
Dr. Madilyn Fireman    I received a call from [facility] and they said that they could not see where patient had Diagnostic Bilateral I called them back and I am faxing reports from the bilateral and biopsy and told them it was the 3 month F/U and they stated since it was so close to her next bilateral they would go ahead an do that as well and focus ultrasound on Left side. Please place order for Bilateral diagnostic mammogram for me to fax to them   [facility]

## 2020-10-02 NOTE — Telephone Encounter (Signed)
She gets this done as [place] so not sure why [facility] needs an order.  It sound like they are just trying to get her to go back there.

## 2020-10-03 NOTE — Addendum Note (Signed)
Addended by: Teddy Spike on: 10/03/2020 12:57 PM   Modules accepted: Orders

## 2020-10-03 NOTE — Telephone Encounter (Signed)
Pt's insurance will not cover for her to have her mammograms to be done at the breast center. She will have to go to [place] .    Mammo ordered

## 2020-10-08 ENCOUNTER — Other Ambulatory Visit: Payer: Self-pay

## 2020-10-08 DIAGNOSIS — R928 Other abnormal and inconclusive findings on diagnostic imaging of breast: Secondary | ICD-10-CM

## 2020-12-07 ENCOUNTER — Encounter: Payer: Self-pay | Admitting: Family Medicine

## 2020-12-07 ENCOUNTER — Ambulatory Visit (INDEPENDENT_AMBULATORY_CARE_PROVIDER_SITE_OTHER): Payer: No Typology Code available for payment source | Admitting: Family Medicine

## 2020-12-07 ENCOUNTER — Other Ambulatory Visit: Payer: Self-pay

## 2020-12-07 VITALS — BP 122/59 | HR 74 | Ht 66.0 in | Wt 111.6 lb

## 2020-12-07 DIAGNOSIS — R928 Other abnormal and inconclusive findings on diagnostic imaging of breast: Secondary | ICD-10-CM

## 2020-12-07 DIAGNOSIS — F341 Dysthymic disorder: Secondary | ICD-10-CM

## 2020-12-07 MED ORDER — FLUOXETINE HCL 40 MG PO CAPS
40.0000 mg | ORAL_CAPSULE | Freq: Every day | ORAL | 1 refills | Status: DC
Start: 2020-12-07 — End: 2021-03-04

## 2020-12-07 NOTE — Patient Instructions (Signed)
If you decide to taper the medication in the fall we would decrease you to 30 mg for 2 weeks, then 20 mg for 2 weeks, then 10 mg for 2 weeks and then 10 mg every other day for 2 weeks and then stop.  We would need to send in some 10 mg capsules for you to be able to do this taper but again if you would like to do this just let me know and we can send in a new prescription.

## 2020-12-07 NOTE — Progress Notes (Signed)
Established Patient Office Visit  Subjective:  Patient ID: Joann Haynes, female    DOB: 1970/12/26  Age: 50 y.o. MRN: [id number]  CC:  Chief Complaint  Patient presents with   Depression     HPI Joann Haynes presents for anxiety/Depression -she says she was doing well and in fact was planning on coming in to ask if we could taper her off of the fluoxetine last time she was here was about a year ago.  But unfortunately her dog was just diagnosed with a bone tumor he is not been doing well for about a month and she has had trouble getting care for him.  She has been really upset about it.  She did have a diagnostic mammogram last fall in September and was supposed to follow back up in 6 months but has been able unable to pay the diagnostic bill as her insurance did not cover it and so has not rescheduled for her 73-monthfollow-up.  Has applied for patient assistance and says she is faxed the form multiple times but they keep saying that they have not gotten it.   Past Medical History:  Diagnosis Date   Bed wetting 09/12/2010   Depression    Uterine polyp     Past Surgical History:  Procedure Laterality Date   COLONOSCOPY      Family History  Problem Relation Age of Onset   Hypertension Mother    Breast cancer Mother 633  Colon cancer Maternal Uncle 561  Colon polyps Maternal Uncle 5103  Diabetes Maternal Grandfather    Esophageal cancer Neg Hx    Rectal cancer Neg Hx    Stomach cancer Neg Hx     Social History   Socioeconomic History   Marital status: Married    Spouse name: Not on file   Number of children: Not on file   Years of education: Not on file   Highest education level: Not on file  Occupational History   Not on file  Tobacco Use   Smoking status: Never   Smokeless tobacco: Never  Vaping Use   Vaping Use: Never used  Substance and Sexual Activity   Alcohol use: Yes    Comment: social   Drug use: No   Sexual activity: Yes    Birth control/protection:  None    Comment: married, o kids, 2 yrs college, works at [employer] regularly exercises, does yoga/aerobic exercuse 3 times wk., 2 caffiene drinks per day.    Other Topics Concern   Not on file  Social History Narrative   Not on file   Social Determinants of Health   Financial Resource Strain: Not on file  Food Insecurity: Not on file  Transportation Needs: Not on file  Physical Activity: Not on file  Stress: Not on file  Social Connections: Not on file  Intimate Partner Violence: Not on file    Outpatient Medications Prior to Visit  Medication Sig Dispense Refill   FLUoxetine (PROZAC) 40 MG capsule Take 1 capsule by mouth once daily 90 capsule 1   ondansetron (ZOFRAN ODT) 4 MG disintegrating tablet Take 1 tablet (4 mg total) by mouth every 8 (eight) hours as needed for nausea or vomiting. (Patient not taking: Reported on 12/07/2020) 20 tablet 0   PREVIDENT 5000 BOOSTER PLUS 1.1 % PSTE Place onto teeth. (Patient not taking: Reported on 12/07/2020)     benzonatate (TESSALON) 200 MG capsule Take 1 capsule (200 mg total) by mouth 3 (three) times  daily as needed for cough. 30 capsule 0   oseltamivir (TAMIFLU) 75 MG capsule Take 1 capsule (75 mg total) by mouth 2 (two) times daily. 10 capsule 0   No facility-administered medications prior to visit.    No Known Allergies  ROS Review of Systems    Objective:    Physical Exam Constitutional:      Appearance: Normal appearance. She is well-developed.  HENT:     Head: Normocephalic and atraumatic.  Cardiovascular:     Rate and Rhythm: Normal rate and regular rhythm.     Heart sounds: Normal heart sounds.  Pulmonary:     Effort: Pulmonary effort is normal.     Breath sounds: Normal breath sounds.  Skin:    General: Skin is warm and dry.  Neurological:     Mental Status: She is alert and oriented to person, place, and time.  Psychiatric:        Behavior: Behavior normal.   BP (!) 122/59 (BP Location: Left Arm, Patient  Position: Sitting, Cuff Size: Small)   Pulse 74   Ht '5\' 6"'$  (1.676 m)   Wt 111 lb 9.6 oz (50.6 kg)   SpO2 99%   BMI 18.01 kg/m  Wt Readings from Last 3 Encounters:  12/07/20 111 lb 9.6 oz (50.6 kg)  04/04/20 118 lb (53.5 kg)  12/16/19 124 lb (56.2 kg)     Health Maintenance Due  Topic Date Due   Pneumococcal Vaccine 62-94 Years old (1 - PCV) Never done   Zoster Vaccines- Shingrix (1 of 2) Never done   COVID-19 Vaccine (3 - Moderna risk series) 09/23/2019   INFLUENZA VACCINE  11/19/2020    There are no preventive care reminders to display for this patient.  Lab Results  Component Value Date   TSH 2.39 04/15/2017   Lab Results  Component Value Date   WBC 6.9 04/15/2017   HGB 12.2 04/15/2017   HCT 35.1 04/15/2017   MCV 89.1 04/15/2017   PLT 276 04/15/2017   Lab Results  Component Value Date   NA 139 07/12/2010   K 4.3 07/12/2010   CO2 25 07/12/2010   GLUCOSE 82 07/12/2010   BUN 12 07/12/2010   CREATININE 0.77 07/12/2010   BILITOT 0.7 07/12/2010   ALKPHOS 64 07/12/2010   AST 17 07/12/2010   ALT 13 07/12/2010   PROT 7.4 07/12/2010   ALBUMIN 4.7 07/12/2010   CALCIUM 9.5 07/12/2010   Lab Results  Component Value Date   CHOL 136 07/12/2010   Lab Results  Component Value Date   HDL 54 07/12/2010   Lab Results  Component Value Date   LDLCALC 72 07/12/2010   Lab Results  Component Value Date   TRIG 52 07/12/2010   Lab Results  Component Value Date   CHOLHDL 2.5 07/12/2010   No results found for: HGBA1C    Assessment & Plan:   Problem List Items Addressed This Visit       Other   ANXIETY DEPRESSION - Primary    If you decide to taper the medication in the fall we would decrease you to 30 mg for 2 weeks, then 20 mg for 2 weeks, then 10 mg for 2 weeks and then 10 mg every other day for 2 weeks and then stop.  We would need to send in some 10 mg capsules for you to be able to do this taper but again if you would like to do this just let me know and we  can send in a new prescription.      Relevant Medications   FLUoxetine (PROZAC) 40 MG capsule   Abnormal mammogram   Reminded her that she is due for Tdap in October.  She may want to check with her insurance to see if it is cheaper to do here or at the pharmacy.  We are having to try to help fax forms for financial assistance for the mammogram. I really want her to be able to get her f/u mammogram.    Meds ordered this encounter  Medications   FLUoxetine (PROZAC) 40 MG capsule    Sig: Take 1 capsule (40 mg total) by mouth daily.    Dispense:  90 capsule    Refill:  1     Follow-up: Return in about 1 year (around 12/07/2021) for Mood medication .    Beatrice Lecher, MD

## 2020-12-07 NOTE — Assessment & Plan Note (Signed)
If you decide to taper the medication in the fall we would decrease you to 30 mg for 2 weeks, then 20 mg for 2 weeks, then 10 mg for 2 weeks and then 10 mg every other day for 2 weeks and then stop.  We would need to send in some 10 mg capsules for you to be able to do this taper but again if you would like to do this just let me know and we can send in a new prescription.

## 2020-12-08 DIAGNOSIS — R928 Other abnormal and inconclusive findings on diagnostic imaging of breast: Secondary | ICD-10-CM | POA: Insufficient documentation

## 2020-12-17 ENCOUNTER — Telehealth: Payer: Self-pay | Admitting: Family Medicine

## 2020-12-17 NOTE — Telephone Encounter (Signed)
Patient dropped off paperwork, stated that she had already filled it out, just needed it faxed, she stated PCP knew about it. Billing form attached and placed in provider box. AM *12/17/20

## 2020-12-17 NOTE — Telephone Encounter (Signed)
EDITED: Sorry guys... It is definitely a Monday!! I have faxed this for patient so did not place in provider box. Sorry about this mix up. AM *12/17/20

## 2021-01-31 ENCOUNTER — Telehealth: Payer: Self-pay

## 2021-01-31 ENCOUNTER — Emergency Department (INDEPENDENT_AMBULATORY_CARE_PROVIDER_SITE_OTHER)
Admission: EM | Admit: 2021-01-31 | Discharge: 2021-01-31 | Disposition: A | Discharge status: Home / Self Care | Payer: No Typology Code available for payment source | Source: Home / Self Care

## 2021-01-31 ENCOUNTER — Other Ambulatory Visit: Payer: Self-pay

## 2021-01-31 DIAGNOSIS — R509 Fever, unspecified: Secondary | ICD-10-CM | POA: Diagnosis not present

## 2021-01-31 DIAGNOSIS — R059 Cough, unspecified: Secondary | ICD-10-CM

## 2021-01-31 DIAGNOSIS — J309 Allergic rhinitis, unspecified: Secondary | ICD-10-CM | POA: Diagnosis not present

## 2021-01-31 MED ORDER — ACETAMINOPHEN 500 MG PO TABS: 1000.0000 mg | ORAL_TABLET | Freq: Once | ORAL | Status: DC

## 2021-01-31 MED ORDER — BENZONATATE 200 MG PO CAPS: 200.0000 mg | ORAL_CAPSULE | Freq: Three times a day (TID) | ORAL | 0 refills | Status: AC | PRN

## 2021-01-31 MED ORDER — FEXOFENADINE HCL 180 MG PO TABS: 180.0000 mg | ORAL_TABLET | Freq: Every day | ORAL | 0 refills | Status: DC

## 2021-01-31 NOTE — Telephone Encounter (Signed)
Pt called stating pharmacy does not have rx. Call made to pharmacy. Pt allegra almost ready. Tessalon OOS, will be in stock tomorrow. Advised pt to either transfer rx or if she wants to wait until tomorrow  and pick up Delsym or other otc cough med in the meantime. Pt acknowledged.

## 2021-01-31 NOTE — ED Provider Notes (Signed)
[facility]    CSN: [id number] Arrival date & time: 01/31/21  1035      History   Chief Complaint Chief Complaint  Patient presents with   Nasal Congestion   Cough   Fever    HPI Blannie Shedlock is a 50 y.o. female.   HPI 50 year old female presents with fever, cough, nasal congestion for 2 days.  Patient reports exposure to COVID-19 while at church on Sunday, 01/27/21.  Patient is vaccinated for COVID and has 1 COVID booster.  Past Medical History:  Diagnosis Date   Bed wetting 09/12/2010   Depression    Uterine polyp     Patient Active Problem List   Diagnosis Date Noted   Abnormal mammogram 12/08/2020   Urinary frequency 04/15/2017   Cervical lesion 04/15/2017   Microscopic hematuria 04/15/2017   Abnormal uterine bleeding (AUB) 04/15/2017   ANXIETY DEPRESSION 03/25/2010    Past Surgical History:  Procedure Laterality Date   COLONOSCOPY      OB History     Gravida  0   Para  0   Term  0   Preterm  0   AB  0   Living  0      SAB  0   IAB  0   Ectopic  0   Multiple  0   Live Births  0            Home Medications    Prior to Admission medications   Medication Sig Start Date End Date Taking? Authorizing Provider  benzonatate (TESSALON) 200 MG capsule Take 1 capsule (200 mg total) by mouth 3 (three) times daily as needed for up to 7 days for cough. 01/31/21 02/07/21 Yes Eliezer Lofts, FNP  fexofenadine [facility] ALLERGY) 180 MG tablet Take 1 tablet (180 mg total) by mouth daily for 15 days. 01/31/21 02/15/21 Yes Eliezer Lofts, FNP  FLUoxetine (PROZAC) 40 MG capsule Take 1 capsule (40 mg total) by mouth daily. 12/07/20   Hali Marry, MD  ondansetron (ZOFRAN ODT) 4 MG disintegrating tablet Take 1 tablet (4 mg total) by mouth every 8 (eight) hours as needed for nausea or vomiting. Patient not taking: Reported on 12/07/2020 04/04/20   Luetta Nutting, DO  PREVIDENT 5000 BOOSTER PLUS 1.1 % PSTE Place onto teeth. Patient  not taking: Reported on 12/07/2020 06/07/19   [provider]    Family History Family History  Problem Relation Age of Onset   Hypertension Mother    Breast cancer Mother 18   Colon cancer Maternal Uncle 36   Colon polyps Maternal Uncle 58   Diabetes Maternal Grandfather    Esophageal cancer Neg Hx    Rectal cancer Neg Hx    Stomach cancer Neg Hx     Social History Social History   Tobacco Use   Smoking status: Never   Smokeless tobacco: Never  Vaping Use   Vaping Use: Never used  Substance Use Topics   Alcohol use: Yes    Comment: social   Drug use: No     Allergies   Patient has no known allergies.   Review of Systems Review of Systems  Constitutional:  Positive for fever.  HENT:  Positive for congestion.   Respiratory:  Positive for cough.   All other systems reviewed and are negative.   Physical Exam Triage Vital Signs ED Triage Vitals  Enc Vitals Group     BP 01/31/21 1113 109/67     Pulse Rate 01/31/21 1113  84     Resp 01/31/21 1113 18     Temp 01/31/21 1113 99.6 F (37.6 C)     Temp Source 01/31/21 1113 Oral     SpO2 01/31/21 1113 100 %     Weight --      Height --      Head Circumference --      Peak Flow --      Pain Score 01/31/21 1115 0     Pain Loc --      Pain Edu? --      Excl. in [place]? --    No data found.  Updated Vital Signs BP 109/67 (BP Location: Right Arm)   Pulse 84   Temp 99.6 F (37.6 C) (Oral)   Resp 18   SpO2 100%       Physical Exam Vitals and nursing note reviewed.  Constitutional:      General: She is not in acute distress.    Appearance: Normal appearance. She is normal weight. She is not ill-appearing.  HENT:     Right Ear: Tympanic membrane, ear canal and external ear normal.     Left Ear: Tympanic membrane, ear canal and external ear normal.     Mouth/Throat:     Mouth: Mucous membranes are moist.     Pharynx: Oropharynx is clear.     Comments: Moderate mount of clear drainage of posterior  oropharynx noted Eyes:     Extraocular Movements: Extraocular movements intact.     Conjunctiva/sclera: Conjunctivae normal.     Pupils: Pupils are equal, round, and reactive to light.  Cardiovascular:     Rate and Rhythm: Normal rate and regular rhythm.     Pulses: Normal pulses.     Heart sounds: Normal heart sounds.  Pulmonary:     Effort: Pulmonary effort is normal.     Breath sounds: Normal breath sounds.     Comments: No adventitious breath sounds noted Musculoskeletal:        General: Normal range of motion.     Cervical back: Normal range of motion and neck supple.  Skin:    General: Skin is warm and dry.  Neurological:     General: No focal deficit present.     Mental Status: She is alert and oriented to person, place, and time. Mental status is at baseline.  Psychiatric:        Mood and Affect: Mood normal.        Behavior: Behavior normal.        Thought Content: Thought content normal.     UC Treatments / Results  Labs (all labs ordered are listed, but only abnormal results are displayed) Labs Reviewed  COVID-19, FLU A+B NAA    EKG   Radiology No results found.  Procedures Procedures (including critical care time)  Medications Ordered in UC Medications  acetaminophen (TYLENOL) tablet 1,000 mg (has no administration in time range)    Initial Impression / Assessment and Plan / UC Course  I have reviewed the triage vital signs and the nursing notes.  Pertinent labs & imaging results that were available during my care of the patient were reviewed by me and considered in my medical decision making (see chart for details).     MDM: 1.  Fever-1000 mg Tylenol given once in clinic, COVID-19 flu A/B ordered. Advised patient conservative measures for now may alternate OTC Tylenol 1000 mg 1-2 times daily, as needed with OTC Ibuprofen 400 mg 1-2 times daily,  as needed; 2. Cough-Rx'd Tessalon Perles; 3.  Allergic rhinitis Rx'd Allegra. nstructed/advised patient  to take Allegra daily for the next 5 to 7 days for concurrent postnasal drainage/drip, then as needed.  Advised patient may take Tessalon Perles daily for cough or as needed.  Advised/instructed patient we will follow-up with COVID-19 flu A&B results once received.  Work note provided per request.  Patient discharged home, hemodynamically stable. Final Clinical Impressions(s) / UC Diagnoses   Final diagnoses:  Fever, unspecified  Cough, unspecified type  Allergic rhinitis, unspecified seasonality, unspecified trigger     Discharge Instructions      Instructed/advised patient to take Allegra daily for the next 5 to 7 days for concurrent postnasal drainage/drip, then as needed.  Advised patient may take Tessalon Perles daily for cough or as needed.  Advised patient conservative measures for now may alternate OTC Tylenol 1000 mg 1-2 times daily, as needed with OTC Ibuprofen 400 mg 1-2 times daily, as needed.  Advised/instructed patient we will follow-up with COVID-19 flu A&B results once received.  Work note provided per request.     ED Prescriptions     Medication Sig Dispense Auth. Provider   benzonatate (TESSALON) 200 MG capsule Take 1 capsule (200 mg total) by mouth 3 (three) times daily as needed for up to 7 days for cough. 30 capsule Eliezer Lofts, FNP   fexofenadine [facility] ALLERGY) 180 MG tablet Take 1 tablet (180 mg total) by mouth daily for 15 days. 15 tablet Eliezer Lofts, FNP      PDMP not reviewed this encounter.   Eliezer Lofts, [place] 01/31/21 1301

## 2021-01-31 NOTE — Discharge Instructions (Addendum)
Instructed/advised patient to take Allegra daily for the next 5 to 7 days for concurrent postnasal drainage/drip, then as needed.  Advised patient may take Tessalon Perles daily for cough or as needed.  Advised patient conservative measures for now may alternate OTC Tylenol 1000 mg 1-2 times daily, as needed with OTC Ibuprofen 400 mg 1-2 times daily, as needed.  Advised/instructed patient we will follow-up with COVID-19 flu A&B results once received.  Work note provided per request.

## 2021-01-31 NOTE — ED Triage Notes (Signed)
Pt c/o cough, runny nose and fever x 2 days. ColdEeze prn. COVID exposure at church. COVID vaccinated and 1 booster.

## 2021-02-02 LAB — COVID-19, FLU A+B NAA
Influenza A, NAA: NOT DETECTED
Influenza B, NAA: NOT DETECTED
SARS-CoV-2, NAA: DETECTED — AB

## 2021-02-04 ENCOUNTER — Telehealth: Payer: Self-pay | Admitting: Family Medicine

## 2021-02-04 NOTE — Telephone Encounter (Signed)
Yes ok to schedule for Tdap. She needs flu and shingles too.

## 2021-02-04 NOTE — Telephone Encounter (Signed)
Patient would like to come in tomorrow morning for a tetanus shot.

## 2021-02-05 ENCOUNTER — Ambulatory Visit (INDEPENDENT_AMBULATORY_CARE_PROVIDER_SITE_OTHER): Payer: No Typology Code available for payment source | Admitting: Family Medicine

## 2021-02-05 ENCOUNTER — Other Ambulatory Visit: Payer: Self-pay

## 2021-02-05 VITALS — BP 114/71 | HR 79 | Temp 98.8°F

## 2021-02-05 DIAGNOSIS — Z23 Encounter for immunization: Secondary | ICD-10-CM

## 2021-02-05 NOTE — Telephone Encounter (Signed)
Patient scheduled.

## 2021-02-05 NOTE — Progress Notes (Signed)
Pt in for Tdap and flu vaccine.  Pt will get shingles vaccine at a later date.

## 2021-02-05 NOTE — Progress Notes (Signed)
Agree with documentation as above.   Rachell Druckenmiller, MD  

## 2021-03-01 ENCOUNTER — Telehealth: Payer: Self-pay | Admitting: *Deleted

## 2021-03-01 NOTE — Telephone Encounter (Signed)
Pt called stating that she had spoken to Dr. Madilyn Fireman at her last office visit about tapering off of the Fluoxetine.     Will send to pcp for recommendations.

## 2021-03-04 MED ORDER — FLUOXETINE HCL 10 MG PO CAPS: ORAL_CAPSULE | ORAL | 0 refills | Status: DC

## 2021-03-04 NOTE — Telephone Encounter (Signed)
A per sent to [facility].  If she feels like the taper is too quick we can always slow it down a little bit and send over a refill if needed.

## 2021-03-04 NOTE — Telephone Encounter (Signed)
LVM for pt to call to discuss.  T. Stephfon Bovey, CMA  

## 2021-03-05 ENCOUNTER — Other Ambulatory Visit: Payer: Self-pay | Admitting: *Deleted

## 2021-03-05 MED ORDER — FLUOXETINE HCL 10 MG PO CAPS: ORAL_CAPSULE | ORAL | 0 refills | Status: DC

## 2021-03-05 NOTE — Telephone Encounter (Signed)
LVM for pt to call to discuss.  T. Keria Widrig, CMA  

## 2021-03-05 NOTE — Telephone Encounter (Signed)
Spoke w/pt and advised her of taper.

## 2021-04-09 ENCOUNTER — Telehealth: Payer: Self-pay | Admitting: Family Medicine

## 2021-04-09 NOTE — Telephone Encounter (Signed)
Patient came in to office and dropped off paperwork for a work assessment to be completed by Dr. Madilyn Fireman. Patient was informed of a possible $29 fee and a 3-5 day turn around. The patient requested that if possible, could the form be ready for pick by Friday, 12/23rd, 2022, due to her startng a new job on Monday, 12/26, 2022. I informed the patient that I would make a note of her request. Paper work placed in providers box. -LMR

## 2021-04-10 NOTE — Telephone Encounter (Signed)
Called and lvm advising that her form is up front ready to be picked up.

## 2021-04-26 ENCOUNTER — Other Ambulatory Visit: Payer: Self-pay | Admitting: *Deleted

## 2021-05-07 ENCOUNTER — Encounter: Payer: Self-pay | Admitting: Physician Assistant

## 2021-05-07 ENCOUNTER — Ambulatory Visit (INDEPENDENT_AMBULATORY_CARE_PROVIDER_SITE_OTHER): Payer: 59 | Admitting: Physician Assistant

## 2021-05-07 ENCOUNTER — Other Ambulatory Visit: Payer: Self-pay

## 2021-05-07 VITALS — BP 121/71 | HR 60 | Ht 66.0 in | Wt 117.0 lb

## 2021-05-07 DIAGNOSIS — R051 Acute cough: Secondary | ICD-10-CM | POA: Insufficient documentation

## 2021-05-07 DIAGNOSIS — F341 Dysthymic disorder: Secondary | ICD-10-CM

## 2021-05-07 DIAGNOSIS — J069 Acute upper respiratory infection, unspecified: Secondary | ICD-10-CM | POA: Diagnosis not present

## 2021-05-07 DIAGNOSIS — R69 Illness, unspecified: Secondary | ICD-10-CM | POA: Diagnosis not present

## 2021-05-07 MED ORDER — FLUTICASONE PROPIONATE 50 MCG/ACT NA SUSP: 2.0000 | Freq: Every day | NASAL | 2 refills | Status: DC

## 2021-05-07 MED ORDER — FLUOXETINE HCL 10 MG PO CAPS: 10.0000 mg | ORAL_CAPSULE | Freq: Every day | ORAL | 0 refills | Status: DC

## 2021-05-07 MED ORDER — BENZONATATE 200 MG PO CAPS: 200.0000 mg | ORAL_CAPSULE | Freq: Three times a day (TID) | ORAL | 0 refills | Status: DC | PRN

## 2021-05-07 NOTE — Patient Instructions (Signed)

## 2021-05-07 NOTE — Progress Notes (Signed)
Acute Office Visit  Subjective:    Patient ID: Joann Haynes, female    DOB: 1970-12-07, 51 y.o.   MRN: [id number]  Chief Complaint  Patient presents with   Cough    Cough Associated symptoms include rhinorrhea. Pertinent negatives include no chest pain, chills, ear pain, fever, sore throat or shortness of breath.  51 YO patient is in today for cough, sinus congestion, and body aches. Patients symptoms started on Sunday and she reports they are worsening. She reports fatigue, dry cough, and yellow to clear drainage from her nose. Patient has been using tylenol and theraflu which have helped. She denies fever, chills. 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 She is vaccinated for the flu and covid.   Patient also requests a fluoxetine refill in order to taper off of medication.   Past Medical History:  Diagnosis Date   Bed wetting 09/12/2010   Depression    Uterine polyp     Past Surgical History:  Procedure Laterality Date   COLONOSCOPY      Family History  Problem Relation Age of Onset   Hypertension Mother    Breast cancer Mother 55   Colon cancer Maternal Uncle 67   Colon polyps Maternal Uncle 20   Diabetes Maternal Grandfather    Esophageal cancer Neg Hx    Rectal cancer Neg Hx    Stomach cancer Neg Hx     Social History   Socioeconomic History   Marital status: Married    Spouse name: Not on file   Number of children: Not on file   Years of education: Not on file   Highest education level: Not on file  Occupational History   Not on file  Tobacco Use   Smoking status: Never   Smokeless tobacco: Never  Vaping Use   Vaping Use: Never used  Substance and Sexual Activity   Alcohol use: Yes    Comment: social   Drug use: No   Sexual activity: Yes    Birth control/protection: None    Comment: married, o kids, 2 yrs college, works at [employer], regularly exercises, does yoga/aerobic exercuse 3 times wk., 2 caffiene drinks per  day.    Other Topics Concern   Not on file  Social History Narrative   Not on file   Social Determinants of Health   Financial Resource Strain: Not on file  Food Insecurity: Not on file  Transportation Needs: Not on file  Physical Activity: Not on file  Stress: Not on file  Social Connections: Not on file  Intimate Partner Violence: Not on file    Outpatient Medications Prior to Visit  Medication Sig Dispense Refill   FLUoxetine (PROZAC) 10 MG capsule Take 10 mg by mouth daily.     fexofenadine (ALLEGRA ALLERGY) 180 MG tablet Take 1 tablet (180 mg total) by mouth daily for 15 days. 15 tablet 0   No facility-administered medications prior to visit.    No Known Allergies  Review of Systems  Constitutional:  Positive for fatigue. Negative for chills and fever.  HENT:  Positive for congestion, rhinorrhea and sinus pressure. Negative for ear discharge, ear pain, facial swelling and sore throat.   Respiratory:  Positive for cough. Negative for shortness of breath.   Cardiovascular:  Negative for chest pain.  Gastrointestinal:  Negative for abdominal pain, constipation, diarrhea, nausea and vomiting.      Objective:    Physical Exam Constitutional:  General: She is not in acute distress.    Appearance: Normal appearance. She is normal weight. She is not ill-appearing, toxic-appearing or diaphoretic.  HENT:     Head: Normocephalic and atraumatic.     Right Ear: Tympanic membrane, ear canal and external ear normal.     Left Ear: Tympanic membrane, ear canal and external ear normal.     Nose: Congestion present.     Mouth/Throat:     Mouth: Mucous membranes are moist.     Pharynx: No oropharyngeal exudate or posterior oropharyngeal erythema.  Eyes:     Conjunctiva/sclera: Conjunctivae normal.  Cardiovascular:     Rate and Rhythm: Normal rate and regular rhythm.     Pulses: Normal pulses.     Heart sounds: Normal heart sounds.  Pulmonary:     Effort: Pulmonary effort  is normal. No respiratory distress.     Breath sounds: Normal breath sounds.  Skin:    General: Skin is warm and dry.  Neurological:     Mental Status: She is alert and oriented to person, place, and time.  Psychiatric:        Mood and Affect: Mood normal.        Behavior: Behavior normal.        Thought Content: Thought content normal.        Judgment: Judgment normal.    BP 121/71    Pulse 60    Ht 5\' 6"  (1.676 m)    Wt 117 lb (53.1 kg)    SpO2 99%    BMI 18.88 kg/m  Wt Readings from Last 3 Encounters:  05/07/21 117 lb (53.1 kg)  12/07/20 111 lb 9.6 oz (50.6 kg)  04/04/20 118 lb (53.5 kg)    Health Maintenance Due  Topic Date Due   Pneumococcal Vaccine 64-26 Years old (1 - PCV) Never done   HIV Screening  Never done   Hepatitis C Screening  Never done   Zoster Vaccines- Shingrix (1 of 2) Never done   COVID-19 Vaccine (3 - Moderna risk series) 09/23/2019    There are no preventive care reminders to display for this patient.         Assessment & Plan:   Marland KitchenMarland KitchenZuma was seen today for cough.  Diagnoses and all orders for this visit:  Viral upper respiratory tract infection -     fluticasone (FLONASE) 50 MCG/ACT nasal spray; Place 2 sprays into both nostrils daily. -     benzonatate (TESSALON) 200 MG capsule; Take 1 capsule (200 mg total) by mouth 3 (three) times daily as needed.  Acute cough -     fluticasone (FLONASE) 50 MCG/ACT nasal spray; Place 2 sprays into both nostrils daily. -     benzonatate (TESSALON) 200 MG capsule; Take 1 capsule (200 mg total) by mouth 3 (three) times daily as needed.  Dysthymic disorder -     FLUoxetine (PROZAC) 10 MG capsule; Take 1 capsule (10 mg total) by mouth daily.     Start flonase to help resolve sinus congestion. Start tessalon pearls as needed for cough suppression. Continue tylenol and theraflu for symptom control. Encouraged nasal saline rinses. Educated patient on likely viral nature of disease, however, told her to  notify us if after 7 days of symptoms she is worsening. Will consider antibiotic treatment if symptoms worsen or persist after 7 days.   Refilled fluoxetine for patient to be able to taper off of medication.   Iran Planas, PA-C

## 2021-05-17 ENCOUNTER — Encounter: Payer: Self-pay | Admitting: Emergency Medicine

## 2021-05-17 ENCOUNTER — Emergency Department
Admission: EM | Admit: 2021-05-17 | Discharge: 2021-05-17 | Disposition: A | Discharge status: Home / Self Care | Payer: 59 | Source: Home / Self Care

## 2021-05-17 ENCOUNTER — Other Ambulatory Visit: Payer: Self-pay

## 2021-05-17 DIAGNOSIS — R112 Nausea with vomiting, unspecified: Secondary | ICD-10-CM

## 2021-05-17 MED ORDER — ONDANSETRON 4 MG PO TBDP: 4.0000 mg | ORAL_TABLET | Freq: Three times a day (TID) | ORAL | 0 refills | Status: DC | PRN

## 2021-05-17 NOTE — Discharge Instructions (Addendum)
Advised patient may take Zofran daily or as needed for emesis courage patient to increase daily fluid/water intake particularly Gatorade G2 to replace cardiac electrolytes.

## 2021-05-17 NOTE — ED Triage Notes (Addendum)
Emesis, headache last night Feels a lot better today, ate breakfast Vaccinated for Covid and Flu

## 2021-05-17 NOTE — ED Provider Notes (Signed)
[facility]    CSN: [id number] Arrival date & time: 05/17/21  0906      History   Chief Complaint Chief Complaint  Patient presents with   Emesis    HPI Joann Haynes is a 51 y.o. female.   HPI 51 year old female presents with vomiting and headache last night.  Reports that she feels much better this morning and has eaten breakfast prior to this office visit.  PMH significant for urinary frequency and anxiety/depression.  Past Medical History:  Diagnosis Date   Bed wetting 09/12/2010   Depression    Uterine polyp     Patient Active Problem List   Diagnosis Date Noted   Acute cough 05/07/2021   Viral upper respiratory tract infection 05/07/2021   Abnormal mammogram 12/08/2020   Urinary frequency 04/15/2017   Cervical lesion 04/15/2017   Microscopic hematuria 04/15/2017   Abnormal uterine bleeding (AUB) 04/15/2017   ANXIETY DEPRESSION 03/25/2010    Past Surgical History:  Procedure Laterality Date   COLONOSCOPY      OB History     Gravida  0   Para  0   Term  0   Preterm  0   AB  0   Living  0      SAB  0   IAB  0   Ectopic  0   Multiple  0   Live Births  0            Home Medications    Prior to Admission medications   Medication Sig Start Date End Date Taking? Authorizing Provider  FLUoxetine (PROZAC) 10 MG capsule Take 1 capsule (10 mg total) by mouth daily. 05/07/21   Breeback, Jade L, PA-C  fluticasone (FLONASE) 50 MCG/ACT nasal spray Place 2 sprays into both nostrils daily. 05/07/21   Breeback, Jade L, PA-C  ondansetron (ZOFRAN-ODT) 4 MG disintegrating tablet Take 1 tablet (4 mg total) by mouth every 8 (eight) hours as needed for nausea or vomiting. 05/17/21  Yes Eliezer Lofts, FNP    Family History Family History  Problem Relation Age of Onset   Hypertension Mother    Breast cancer Mother 27   Colon cancer Maternal Uncle 50   Colon polyps Maternal Uncle 11   Diabetes Maternal Grandfather    Esophageal cancer Neg  Hx    Rectal cancer Neg Hx    Stomach cancer Neg Hx     Social History Social History   Tobacco Use   Smoking status: Never   Smokeless tobacco: Never  Vaping Use   Vaping Use: Never used  Substance Use Topics   Alcohol use: Yes    Comment: social   Drug use: No     Allergies   Patient has no known allergies.   Review of Systems Review of Systems  Gastrointestinal:  Positive for vomiting.  Neurological:  Positive for headaches.  All other systems reviewed and are negative.   Physical Exam Triage Vital Signs ED Triage Vitals  Enc Vitals Group     BP 05/17/21 0921 97/65     Pulse Rate 05/17/21 0921 68     Resp 05/17/21 0921 16     Temp 05/17/21 0921 98.5 F (36.9 C)     Temp Source 05/17/21 0921 Oral     SpO2 05/17/21 0921 100 %     Weight 05/17/21 0922 119 lb (54 kg)     Height 05/17/21 0922 5\' 6"  (1.676 m)     Head Circumference --  Peak Flow --      Pain Score 05/17/21 0922 0     Pain Loc --      Pain Edu? --      Excl. in [place]? --    No data found.  Updated Vital Signs BP 97/65 (BP Location: Left Arm)    Pulse 68    Temp 98.5 F (36.9 C) (Oral)    Resp 16    Ht 5\' 6"  (1.676 m)    Wt 119 lb (54 kg)    SpO2 100%    BMI 19.21 kg/m      Physical Exam Vitals and nursing note reviewed.  Constitutional:      Appearance: Normal appearance. She is normal weight.  HENT:     Head: Normocephalic and atraumatic.     Mouth/Throat:     Mouth: Mucous membranes are moist.     Pharynx: Oropharynx is clear.  Eyes:     Extraocular Movements: Extraocular movements intact.     Conjunctiva/sclera: Conjunctivae normal.     Pupils: Pupils are equal, round, and reactive to light.  Cardiovascular:     Rate and Rhythm: Normal rate and regular rhythm.     Pulses: Normal pulses.     Heart sounds: Normal heart sounds. No murmur heard.   No friction rub. No gallop.  Pulmonary:     Effort: Pulmonary effort is normal.     Breath sounds: Normal breath sounds.   Musculoskeletal:     Cervical back: Normal range of motion and neck supple.  Skin:    General: Skin is warm and dry.  Neurological:     General: No focal deficit present.     Mental Status: She is alert and oriented to person, place, and time.     UC Treatments / Results  Labs (all labs ordered are listed, but only abnormal results are displayed) Labs Reviewed - No data to display  EKG   Radiology No results found.  Procedures Procedures (including critical care time)  Medications Ordered in UC Medications - No data to display  Initial Impression / Assessment and Plan / UC Course  I have reviewed the triage vital signs and the nursing notes.  Pertinent labs & imaging results that were available during my care of the patient were reviewed by me and considered in my medical decision making (see chart for details).     MDM: 1. Nausea and vomiting, unspecified vomiting type-Rx'd Zofran, Advised patient may take Zofran daily or as needed for emesis courage patient to increase daily fluid/water intake particularly Gatorade G2 to replace cardiac electrolytes.  Work note provided per patient request prior to discharge today.  Patient discharged home, hemodynamically stable. Final Clinical Impressions(s) / UC Diagnoses   Final diagnoses:  Nausea and vomiting, unspecified vomiting type     Discharge Instructions      Advised patient may take Zofran daily or as needed for emesis courage patient to increase daily fluid/water intake particularly Gatorade G2 to replace cardiac electrolytes.     ED Prescriptions     Medication Sig Dispense Auth. Provider   ondansetron (ZOFRAN-ODT) 4 MG disintegrating tablet Take 1 tablet (4 mg total) by mouth every 8 (eight) hours as needed for nausea or vomiting. 24 tablet Eliezer Lofts, FNP      PDMP not reviewed this encounter.   Eliezer Lofts, [place] 05/17/21 1000

## 2021-05-26 ENCOUNTER — Emergency Department (INDEPENDENT_AMBULATORY_CARE_PROVIDER_SITE_OTHER)
Admission: EM | Admit: 2021-05-26 | Discharge: 2021-05-26 | Disposition: A | Discharge status: Home / Self Care | Payer: 59 | Source: Home / Self Care | Attending: Family Medicine | Admitting: Family Medicine

## 2021-05-26 ENCOUNTER — Encounter: Payer: Self-pay | Admitting: Emergency Medicine

## 2021-05-26 ENCOUNTER — Emergency Department (INDEPENDENT_AMBULATORY_CARE_PROVIDER_SITE_OTHER): Payer: 59

## 2021-05-26 DIAGNOSIS — R109 Unspecified abdominal pain: Secondary | ICD-10-CM

## 2021-05-26 LAB — POCT URINALYSIS DIP (MANUAL ENTRY)
Bilirubin, UA: NEGATIVE
Blood, UA: NEGATIVE
Glucose, UA: NEGATIVE mg/dL
Ketones, POC UA: NEGATIVE mg/dL
Leukocytes, UA: NEGATIVE
Nitrite, UA: NEGATIVE
Spec Grav, UA: 1.015 (ref 1.010–1.025)
Urobilinogen, UA: 0.2 E.U./dL
pH, UA: 6 (ref 5.0–8.0)

## 2021-05-26 MED ORDER — TRAMADOL-ACETAMINOPHEN 37.5-325 MG PO TABS: 1.0000 | ORAL_TABLET | Freq: Four times a day (QID) | ORAL | 0 refills | Status: DC | PRN

## 2021-05-26 NOTE — ED Provider Notes (Signed)
[facility]    CSN: [id number] Arrival date & time: 05/26/21  1211      History   Chief Complaint Chief Complaint  Patient presents with   Flank Pain    right    HPI Joann Haynes is a 51 y.o. female.   HPI  Patient has had right flank pain since yesterday.  Started with nausea.  The pain developed later.  No fever or chills.  No sweats.  She states the pain is in her right flank, right mid abdomen.  It is worse sometimes than others.  Over-the-counter medicines not helping.  She has taken a couple of Zofran to help with the nausea.  She had a normal bowel movement yesterday.  No history of colon problems or irritable bowel.  No history of abdominal surgeries.  Pain is not related to meals although she states it did get worse after a cup of coffee with cream.  Past Medical History:  Diagnosis Date   Bed wetting 09/12/2010   Depression    Uterine polyp     Patient Active Problem List   Diagnosis Date Noted   Acute cough 05/07/2021   Viral upper respiratory tract infection 05/07/2021   Abnormal mammogram 12/08/2020   Urinary frequency 04/15/2017   Cervical lesion 04/15/2017   Microscopic hematuria 04/15/2017   Abnormal uterine bleeding (AUB) 04/15/2017   ANXIETY DEPRESSION 03/25/2010    Past Surgical History:  Procedure Laterality Date   COLONOSCOPY      OB History     Gravida  0   Para  0   Term  0   Preterm  0   AB  0   Living  0      SAB  0   IAB  0   Ectopic  0   Multiple  0   Live Births  0            Home Medications    Prior to Admission medications   Medication Sig Start Date End Date Taking? Authorizing Provider  ondansetron (ZOFRAN-ODT) 4 MG disintegrating tablet Take 1 tablet (4 mg total) by mouth every 8 (eight) hours as needed for nausea or vomiting. 05/17/21  Yes Eliezer Lofts, FNP  traMADol-acetaminophen (ULTRACET) 37.5-325 MG tablet Take 1-2 tablets by mouth every 6 (six) hours as needed. 05/26/21  Yes Raylene Everts, MD  FLUoxetine (PROZAC) 10 MG capsule Take 1 capsule (10 mg total) by mouth daily. 05/07/21   Breeback, Jade L, PA-C  fluticasone (FLONASE) 50 MCG/ACT nasal spray Place 2 sprays into both nostrils daily. 05/07/21   Donella Stade, PA-C    Family History Family History  Problem Relation Age of Onset   Hypertension Mother    Breast cancer Mother 4   Colon cancer Maternal Uncle 50   Colon polyps Maternal Uncle 38   Diabetes Maternal Grandfather    Esophageal cancer Neg Hx    Rectal cancer Neg Hx    Stomach cancer Neg Hx     Social History Social History   Tobacco Use   Smoking status: Never    Passive exposure: Never   Smokeless tobacco: Never  Vaping Use   Vaping Use: Never used  Substance Use Topics   Alcohol use: Not Currently   Drug use: No     Allergies   Patient has no known allergies.   Review of Systems Review of Systems See HPI  Physical Exam Triage Vital Signs ED Triage Vitals  Enc Vitals  Group     BP 05/26/21 1305 125/86     Pulse Rate 05/26/21 1305 70     Resp 05/26/21 1305 16     Temp 05/26/21 1305 99.3 F (37.4 C)     Temp Source 05/26/21 1305 Oral     SpO2 05/26/21 1305 98 %     Weight 05/26/21 1305 118 lb 13.3 oz (53.9 kg)     Height --      Head Circumference --      Peak Flow --      Pain Score 05/26/21 1310 5     Pain Loc --      Pain Edu? --      Excl. in [place]? --    No data found.  Updated Vital Signs BP 125/86 (BP Location: Right Arm)    Pulse 70    Temp 99.3 F (37.4 C) (Oral)    Resp 16    Wt 53.9 kg    LMP 05/12/2021 (Exact Date)    SpO2 98%    BMI 19.18 kg/m      Physical Exam Constitutional:      General: She is in acute distress.     Appearance: She is well-developed.     Comments: Patient appears thin.  Guarded movements.  Walks in a slightly flexed position  HENT:     Head: Normocephalic and atraumatic.     Right Ear: Tympanic membrane and ear canal normal.     Left Ear: Tympanic membrane and ear canal  normal.     Nose: Nose normal. No congestion.     Mouth/Throat:     Pharynx: No posterior oropharyngeal erythema.     Comments: Mask is in place Eyes:     Conjunctiva/sclera: Conjunctivae normal.     Pupils: Pupils are equal, round, and reactive to light.  Cardiovascular:     Rate and Rhythm: Normal rate and regular rhythm.     Heart sounds: Normal heart sounds.  Pulmonary:     Effort: Pulmonary effort is normal. No respiratory distress.     Breath sounds: Normal breath sounds. No wheezing or rhonchi.  Chest:     Chest wall: Tenderness present.  Abdominal:     General: There is no distension.     Palpations: Abdomen is soft.     Tenderness: There is abdominal tenderness. There is no right CVA tenderness or left CVA tenderness.     Comments: No mass.  No organomegaly.  Palpable tenderness in the right mid abdomen.  No guarding or rebound.  No CVA tenderness.  No muscular lumbar tenderness.  No pain with palpation of ribs  Musculoskeletal:        General: Normal range of motion.     Cervical back: Normal range of motion.  Lymphadenopathy:     Cervical: No cervical adenopathy.  Skin:    General: Skin is warm and dry.  Neurological:     General: No focal deficit present.     Mental Status: She is alert.  Psychiatric:        Behavior: Behavior normal.     UC Treatments / Results  Labs (all labs ordered are listed, but only abnormal results are displayed) Labs Reviewed  POCT URINALYSIS DIP (MANUAL ENTRY) - Abnormal; Notable for the following components:      Result Value   Protein Ur, POC trace (*)    All other components within normal limits  URINE CULTURE  AMYLASE  CBC WITH DIFFERENTIAL/PLATELET  COMPLETE  METABOLIC PANEL WITH GFR    EKG   Radiology DG Abdomen 1 View  Result Date: 05/26/2021 CLINICAL DATA:  Right flank pain EXAM: ABDOMEN - 1 VIEW COMPARISON:  None. FINDINGS: Bowel gas pattern is nonspecific. Moderate amount of stool is seen in colon. There is no  fecal impaction in the rectum. No abnormal masses or calcifications are seen. Ureteral calculus could easily be obscured by bowel contents. There are small round calcifications in both sides of pelvis most likely vascular. IMPRESSION: Nonspecific bowel gas pattern. No abnormal masses or calcifications are seen. Electronically Signed   By: Elmer Picker M.D.   On: 05/26/2021 14:42    Procedures Procedures (including critical care time)  Medications Ordered in UC Medications - No data to display  Initial Impression / Assessment and Plan / UC Course  I have reviewed the triage vital signs and the nursing notes.  Pertinent labs & imaging results that were available during my care of the patient were reviewed by me and considered in my medical decision making (see chart for details).     I explained to the patient that I do not find any serious or surgical problem causing her flank pain.  Urinalysis is unremarkable although I will send it for culture.  We will do some screening blood work and have her follow-up with her primary care doctor.  She understands she may need to go to the ER if she gets worse instead of better.  She understands additional imaging may be indicated if her pain persists Final Clinical Impressions(s) / UC Diagnoses   Final diagnoses:  Acute flank pain     Discharge Instructions      Your x-ray is normal The blood test will be available tomorrow You can check for your test results on MyChart Take Ultracet for pain.  Take Zofran if needed for nausea and vomiting I hope that this resolves with time.  If not you should call your primary care doctor You may need additional testing    ED Prescriptions     Medication Sig Dispense Auth. Provider   traMADol-acetaminophen (ULTRACET) 37.5-325 MG tablet Take 1-2 tablets by mouth every 6 (six) hours as needed. 20 tablet Raylene Everts, MD      I have reviewed the PDMP during this encounter.   Raylene Everts, MD 05/26/21 [phone]

## 2021-05-26 NOTE — ED Triage Notes (Addendum)
Right flank pain since 1400 on Saturday radiates around to right abd No hx of kidney stones  OTC  tylenol last night - no meds today  Zofran yesterday  Nausea today  Denies fever Weaning self off fluoxetine - last dose on Friday

## 2021-05-26 NOTE — Discharge Instructions (Addendum)
Your x-ray is normal The blood test will be available tomorrow You can check for your test results on MyChart Take Ultracet for pain.  Take Zofran if needed for nausea and vomiting I hope that this resolves with time.  If not you should call your primary care doctor You may need additional testing

## 2021-05-27 LAB — COMPLETE METABOLIC PANEL WITH GFR
AG Ratio: 1.6 (calc) (ref 1.0–2.5)
ALT: 14 U/L (ref 6–29)
AST: 18 U/L (ref 10–35)
Albumin: 4.4 g/dL (ref 3.6–5.1)
Alkaline phosphatase (APISO): 59 U/L (ref 37–153)
BUN: 10 mg/dL (ref 7–25)
CO2: 29 mmol/L (ref 20–32)
Calcium: 9.5 mg/dL (ref 8.6–10.4)
Chloride: 103 mmol/L (ref 98–110)
Creat: 0.76 mg/dL (ref 0.50–1.03)
Globulin: 2.8 g/dL (calc) (ref 1.9–3.7)
Glucose, Bld: 89 mg/dL (ref 65–99)
Potassium: 4.7 mmol/L (ref 3.5–5.3)
Sodium: 138 mmol/L (ref 135–146)
Total Bilirubin: 1 mg/dL (ref 0.2–1.2)
Total Protein: 7.2 g/dL (ref 6.1–8.1)
eGFR: 95 mL/min/{1.73_m2} (ref >=60)

## 2021-05-27 LAB — CBC WITH DIFFERENTIAL/PLATELET
Absolute Monocytes: 342 cells/uL (ref 200–950)
Basophils Absolute: 30 cells/uL (ref 0–200)
Basophils Relative: 0.5 %
Eosinophils Absolute: 60 cells/uL (ref 15–500)
Eosinophils Relative: 1 %
HCT: 39.7 % (ref 35.0–45.0)
Hemoglobin: 13.5 g/dL (ref 11.7–15.5)
Lymphs Abs: 1692 cells/uL (ref 850–3900)
MCH: 31.4 pg (ref 27.0–33.0)
MCHC: 34 g/dL (ref 32.0–36.0)
MCV: 92.3 fL (ref 80.0–100.0)
MPV: 10.8 fL (ref 7.5–12.5)
Monocytes Relative: 5.7 %
Neutro Abs: 3876 cells/uL (ref 1500–7800)
Neutrophils Relative %: 64.6 %
Platelets: 252 10*3/uL (ref 140–400)
RBC: 4.3 10*6/uL (ref 3.80–5.10)
RDW: 12 % (ref 11.0–15.0)
Total Lymphocyte: 28.2 %
WBC: 6 10*3/uL (ref 3.8–10.8)

## 2021-05-27 LAB — AMYLASE: Amylase: 41 U/L (ref 21–101)

## 2021-05-28 LAB — URINE CULTURE
MICRO NUMBER:: 12966802
SPECIMEN QUALITY:: ADEQUATE

## 2021-06-13 ENCOUNTER — Telehealth (INDEPENDENT_AMBULATORY_CARE_PROVIDER_SITE_OTHER): Payer: 59 | Admitting: Family Medicine

## 2021-06-13 ENCOUNTER — Encounter: Payer: Self-pay | Admitting: Family Medicine

## 2021-06-13 VITALS — Temp 98.3°F | Ht 66.0 in | Wt 117.0 lb

## 2021-06-13 DIAGNOSIS — K529 Noninfective gastroenteritis and colitis, unspecified: Secondary | ICD-10-CM | POA: Diagnosis not present

## 2021-06-13 NOTE — Progress Notes (Signed)
Virtual Visit via Video Note  I connected with Joann Haynes on 06/13/21 at 10:50 AM EST by a video enabled telemedicine application and verified that I am speaking with the correct person using two identifiers.   I discussed the limitations of evaluation and management by telemedicine and the availability of in person appointments. The patient expressed understanding and agreed to proceed.  Patient location: at home Provider location: in office  Subjective:    CC:   Chief Complaint  Patient presents with   Emesis    Body aches, fever, fatigue and chills yesterday. Patient did not take a covid test.     HPI:  C/o of vomiting that started yesterday while she was at work.  She tried to sit down and rest for a little bit but then vomited again and so went home.  She vomited a few more times at home.  Also experiencing body aches, fever, fatigue and chills. Patient did not take a covid test.  No diarrhea.  Fever was 101 last night.  She does have some Zofran at home and says it helped her not to vomit but did not really help with the nausea.  No upper respiratory symptoms.  She was just a little concerned because she had also recently tapered off of her fluoxetine.  She did do a taper but was worried that that might trigger some nausea or vomiting.  And she had gone to urgent care about 3 weeks ago.  At the time she had some nausea and flank pain.  NSAIDs after the fact remember that she had been doing some heavy lifting and before it started.  They did a urinalysis and some x-rays and everything was normal and reassuring she said the pain did eventually get better but then started to come back for couple days and then went away she feels like it is just about gone.   Past medical history, Surgical history, Family history not pertinant except as noted below, Social history, Allergies, and medications have been entered into the medical record, reviewed, and corrections made.    Objective:     General: Speaking clearly in complete sentences without any shortness of breath.  Alert and oriented x3.  Normal judgment. No apparent acute distress.    Impression and Recommendations:    Problem List Items Addressed This Visit   None Visit Diagnoses     Gastroenteritis    -  Primary      Gastroenteritis-discussed that most likely viral etiology especially with fever and headache concomitant with the symptoms.  She is a little concerned because she has had some intermittent nausea on and off even before this happened.  So we discussed that if it continues to please let me know and come in for an office visit so that we can work that up further I think in this case this really was viral.  She already has some Zofran at home.  We will get a go ahead and write her out of work for today okay to return tomorrow.  She is feeling a little better today.  No orders of the defined types were placed in this encounter.   No orders of the defined types were placed in this encounter.  I discussed the assessment and treatment plan with the patient. The patient was provided an opportunity to ask questions and all were answered. The patient agreed with the plan and demonstrated an understanding of the instructions.   The patient was advised to  call back or seek an in-person evaluation if the symptoms worsen or if the condition fails to improve as anticipated.   Beatrice Lecher, MD

## 2021-07-02 ENCOUNTER — Telehealth (INDEPENDENT_AMBULATORY_CARE_PROVIDER_SITE_OTHER): Payer: 59 | Admitting: Family Medicine

## 2021-07-02 ENCOUNTER — Other Ambulatory Visit: Payer: Self-pay

## 2021-07-02 ENCOUNTER — Telehealth: Payer: Self-pay | Admitting: Family Medicine

## 2021-07-02 ENCOUNTER — Encounter: Payer: Self-pay | Admitting: Family Medicine

## 2021-07-02 VITALS — Temp 100.9°F

## 2021-07-02 DIAGNOSIS — R509 Fever, unspecified: Secondary | ICD-10-CM

## 2021-07-02 DIAGNOSIS — J22 Unspecified acute lower respiratory infection: Secondary | ICD-10-CM

## 2021-07-02 LAB — POCT INFLUENZA A/B
Influenza A, POC: NEGATIVE
Influenza B, POC: NEGATIVE

## 2021-07-02 NOTE — Progress Notes (Signed)
? ? ?  Virtual Visit via Video Note ? ?I connected with Joann Haynes on 07/02/21 at  1:00 PM EDT by a video enabled telemedicine application and verified that I am speaking with the correct person using two identifiers. ?  ?I discussed the limitations of evaluation and management by telemedicine and the availability of in person appointments. The patient expressed understanding and agreed to proceed. ? ?Patient location: at home ?Provider location: in office ? ?Subjective:   ? ?CC:  No chief complaint on file. ? ? ?HPI: ? ?Pt report Saturday, 3 days ago, she began feeling bad cough/fever/chills. Sunday took covid test this was neg. Monday she went to work and by the time she got home her sxs got worse. No ST. No diarrhea. + chills.  ?  ?She has been taking tylenol (6 pm yesterday) tessalon (11 pm last night). She hasn't taken anything today. + fever. ?  ?She states that her chest hurts from coughing. Non productive.  no major sinus congestion. No cold medication.  ? ? ? ?Past medical history, Surgical history, Family history not pertinant except as noted below, Social history, Allergies, and medications have been entered into the medical record, reviewed, and corrections made.  ? ? ?Objective:   ? ?General: Speaking clearly in complete sentences without any shortness of breath.  Alert and oriented x3.  Normal judgment. No apparent acute distress. ? ? ? ?Impression and Recommendations:   ? ?Problem List Items Addressed This Visit   ?None ?Visit Diagnoses   ? ? Lower resp. tract infection    -  Primary  ? Fever, unspecified fever cause      ? Relevant Orders  ? POCT Influenza A/B (Completed)  ? ?  ? ?Flu vs bronchitis -flu test was negative.  Most consistent with bronchitis.  We will treat conservatively.  Recommend symptomatic care.  If not feeling better by the end week then please give [place] call back.  Fever persist then please give [place] a call back.  Okay to provide work note if needed. ? ?Orders Placed This Encounter   ?Procedures  ? POCT Influenza A/B  ? ? ?No orders of the defined types were placed in this encounter. ? ?Cam eby the office for flu swab.  ? ?I discussed the assessment and treatment plan with the patient. The patient was provided an opportunity to ask questions and all were answered. The patient agreed with the plan and demonstrated an understanding of the instructions. ?  ?The patient was advised to call back or seek an in-person evaluation if the symptoms worsen or if the condition fails to improve as anticipated. ? ? ?Beatrice Lecher, MD  ? ?

## 2021-07-02 NOTE — Telephone Encounter (Signed)
OK for work note for yesterday, today and tomorrow.   ?

## 2021-07-02 NOTE — Progress Notes (Signed)
HI Joann Haynes, you are negative for flu.  So I would recommend OTC cough and cold medications for the next couple of days. If you are not feeling better by the end of the week please give [place] a call back. We can provide a work Environmental consultant for you if you need one.

## 2021-07-02 NOTE — Telephone Encounter (Signed)
Patient called and wants to know when should she go back to work since she is still running a fever. She also wants to get a work note with that info on it.  ?

## 2021-07-02 NOTE — Progress Notes (Signed)
Pt report Saturday she began feeling bad cough/fever/chills. ?Sunday took covid test this was neg. Monday she went to work and by the time she got home her sxs got worse.  ? ?She has been taking tylenol (6 pm yesterday) tessalon (11 pm last night). She hasn't taken anything today. ? ?She states that her chest hurts from coughing. Non productive.  She reports some bodyaches, tired and not feeling like moving. Asked if she was staying hydrated she said that she is.  ? ?Temp 100.9 '@1153'$  AM ?

## 2021-07-03 NOTE — Telephone Encounter (Signed)
LVM informing pt that letter is available via [employer].  Charyl Bigger, CMA ?

## 2021-07-29 ENCOUNTER — Other Ambulatory Visit: Payer: Self-pay | Admitting: Obstetrics and Gynecology

## 2021-07-29 ENCOUNTER — Other Ambulatory Visit: Payer: Self-pay | Admitting: Family Medicine

## 2021-07-29 DIAGNOSIS — R928 Other abnormal and inconclusive findings on diagnostic imaging of breast: Secondary | ICD-10-CM

## 2021-07-29 DIAGNOSIS — R921 Mammographic calcification found on diagnostic imaging of breast: Secondary | ICD-10-CM

## 2021-08-08 ENCOUNTER — Ambulatory Visit
Admission: RE | Admit: 2021-08-08 | Discharge: 2021-08-08 | Disposition: A | Discharge status: Home / Self Care | Payer: 59 | Source: Ambulatory Visit | Attending: Family Medicine | Admitting: Family Medicine

## 2021-08-08 DIAGNOSIS — N6012 Diffuse cystic mastopathy of left breast: Secondary | ICD-10-CM | POA: Diagnosis not present

## 2021-08-08 DIAGNOSIS — R921 Mammographic calcification found on diagnostic imaging of breast: Secondary | ICD-10-CM

## 2021-08-08 DIAGNOSIS — R922 Inconclusive mammogram: Secondary | ICD-10-CM | POA: Diagnosis not present

## 2021-09-02 ENCOUNTER — Telehealth: Payer: Self-pay | Admitting: Family Medicine

## 2021-09-02 NOTE — Telephone Encounter (Signed)
Patient called and stated she left a vm for Dr. Madilyn Fireman & Nurse but no one has returned her call. Per patient she has not been sleeping well and want to speak to Dr. Madilyn Fireman on what she should do. Offered a virtual appt on 5/30 with [facility] per patient too far away. Also offered to schedule with [facility] on 5/16 but patient declined both appt. Per patient she would rather speak with the nurse before scheduling. ? ?Please advise.  ?

## 2021-09-02 NOTE — Telephone Encounter (Signed)
Needs an appt to discuss sleep since this is a new issue. ?

## 2021-09-03 NOTE — Telephone Encounter (Signed)
Patient has been scheduled

## 2021-09-03 NOTE — Telephone Encounter (Signed)
Left VM for patient to call back to get an appt scheduled. AMUCK ?

## 2021-09-04 ENCOUNTER — Ambulatory Visit (INDEPENDENT_AMBULATORY_CARE_PROVIDER_SITE_OTHER): Payer: 59 | Admitting: Physician Assistant

## 2021-09-04 ENCOUNTER — Encounter: Payer: Self-pay | Admitting: Physician Assistant

## 2021-09-04 VITALS — BP 97/63 | HR 70 | Ht 66.0 in | Wt 114.0 lb

## 2021-09-04 DIAGNOSIS — F341 Dysthymic disorder: Secondary | ICD-10-CM | POA: Diagnosis not present

## 2021-09-04 DIAGNOSIS — G4700 Insomnia, unspecified: Secondary | ICD-10-CM

## 2021-09-04 DIAGNOSIS — R69 Illness, unspecified: Secondary | ICD-10-CM | POA: Diagnosis not present

## 2021-09-04 DIAGNOSIS — F419 Anxiety disorder, unspecified: Secondary | ICD-10-CM | POA: Insufficient documentation

## 2021-09-04 NOTE — Progress Notes (Signed)
? ?Acute Office Visit ? ?Subjective:  ? ?  ?Patient ID: Joann Haynes, female    DOB: 1970/12/12, 51 y.o.   MRN: [id number] ? ?Chief Complaint  ?Patient presents with  ? Insomnia  ? ? ?HPI ?Patient is in today to discuss not sleeping, anxiety, depression for the last 3 months. She has problems getting and staying asleep. This all started after stopping prozac for mood and sleep. She does not want to be on prescription medication for this. She wants information or natural things to try. She felt like prozac made her too numb and she could not cry. It also caused her to urinate frequently. Denies any SI/HC. She does exercise but just has not had any motivation lately. She admits hard to sleep because of her anxious thoughts.  ? ?.. ?Active Ambulatory Problems  ?  Diagnosis Date Noted  ? ANXIETY DEPRESSION 03/25/2010  ? Insomnia 06/27/2010  ? Urinary frequency 04/15/2017  ? Cervical lesion 04/15/2017  ? Microscopic hematuria 04/15/2017  ? Abnormal uterine bleeding (AUB) 04/15/2017  ? Abnormal mammogram 12/08/2020  ? Acute cough 05/07/2021  ? Anxiety 09/04/2021  ? ?Resolved Ambulatory Problems  ?  Diagnosis Date Noted  ? Cough 03/25/2010  ? Bed wetting 09/12/2010  ? 'light-for-dates' infant with signs of fetal malnutrition 02/10/2011  ? Nausea and vomiting 04/04/2020  ? Viral upper respiratory tract infection 05/07/2021  ? ?Past Medical History:  ?Diagnosis Date  ? Depression   ? Uterine polyp   ? ? ? ?ROS ? ?See HPI.  ?   ?Objective:  ?  ?BP 97/63   Pulse 70   Ht '5\' 6"'$  (1.676 m)   Wt 114 lb (51.7 kg)   SpO2 100%   BMI 18.40 kg/m?  ?BP Readings from Last 3 Encounters:  ?09/04/21 97/63  ?05/26/21 125/86  ?05/17/21 97/65  ? ?  ? ?Physical Exam ?Vitals reviewed.  ?Cardiovascular:  ?   Rate and Rhythm: Normal rate.  ?Neurological:  ?   Mental Status: She is alert and oriented to person, place, and time.  ?Psychiatric:  ?   Comments: Flat affect  ? ? ?.. ? ?  09/04/2021  ?  8:30 AM 06/13/2021  ? 10:47 AM 12/07/2020  ?  3:22  PM 11/14/2019  ?  7:20 AM 07/29/2018  ? 11:23 AM  ?Depression screen PHQ 2/9  ?Decreased Interest 1 0 '1 1 1  '$ ?Down, Depressed, Hopeless 1 0 '1 1 1  '$ ?PHQ - 2 Score 2 0 '2 2 2  '$ ?Altered sleeping '2  1 1 1  '$ ?Tired, decreased energy '2  1 1 1  '$ ?Change in appetite 0  0 0 0  ?Feeling bad or failure about yourself  '2  1 1 1  '$ ?Trouble concentrating 1  0 1 0  ?Moving slowly or fidgety/restless 0  1 1 0  ?Suicidal thoughts 0  0 0 1  ?PHQ-9 Score '9  6 7 6  '$ ?Difficult doing work/chores Somewhat difficult  Somewhat difficult Somewhat difficult Somewhat difficult  ? ?.. ? ?  09/04/2021  ?  8:31 AM 12/07/2020  ?  3:22 PM 11/14/2019  ?  7:20 AM 07/29/2018  ? 11:24 AM  ?GAD 7 : Generalized Anxiety Score  ?Nervous, Anxious, on Edge '2 1 1 '$ 0  ?Control/stop worrying 1 1 0 1  ?Worry too much - different things '1 1 1 1  '$ ?Trouble relaxing 2 1 0 1  ?Restless 0 0 0 0  ?Easily annoyed or irritable 3 0 1 1  ?  Afraid - awful might happen 0 1 0 1  ?Total GAD 7 Score '9 5 3 5  '$ ?Anxiety Difficulty Somewhat difficult Somewhat difficult Somewhat difficult Somewhat difficult  ? ? ? ? ?   ?Assessment & Plan:  ?..Sheenah was seen today for insomnia. ? ?Diagnoses and all orders for this visit: ? ?Insomnia, unspecified type ?-     Ambulatory referral to Psychology ? ?Dysthymic disorder ?-     Ambulatory referral to Psychology ? ?Anxiety ?-     Ambulatory referral to Psychology ? ? ?Pt did agree to counseling, referral placed ?PHQ/GAD numbers up from last visits ?Discussed good sleep routine, melatonin, valarian root for insomnia ?HO given ?Discussed lavender/ashwaganda for anxiety ?Visit with lizzy at [employer] ?Continue regular exercise at least 150 minutes a week ? ?Spent 30 minutes with patient discussing anxiety/insomnia/depression and natural treatments.  ? ?Return in about 4 weeks (around 10/02/2021) for PCP. ? ?Iran Planas, PA-C ? ? ?

## 2021-09-04 NOTE — Patient Instructions (Addendum)
Valerian Root ?Melatonin 3-'10mg'$   ? ?For anxiety ? ?Lavender tablets (calm aid) ?[place] ? ?Will refer to counselor ? ? ?Insomnia ?Insomnia is a sleep disorder that makes it difficult to fall asleep or stay asleep. Insomnia can cause fatigue, low energy, difficulty concentrating, mood swings, and poor performance at work or school. ?There are three different ways to classify insomnia: ?Difficulty falling asleep. ?Difficulty staying asleep. ?Waking up too early in the morning. ?Any type of insomnia can be long-term (chronic) or short-term (acute). Both are common. Short-term insomnia usually lasts for 3 months or less. Chronic insomnia occurs at least three times a week for longer than 3 months. ?What are the causes? ?Insomnia may be caused by another condition, situation, or substance, such as: ?Having certain mental health conditions, such as anxiety and depression. ?Using caffeine, alcohol, tobacco, or drugs. ?Having gastrointestinal conditions, such as gastroesophageal reflux disease (GERD). ?Having certain medical conditions. These include: ?Asthma. ?Alzheimer's disease. ?Stroke. ?Chronic pain. ?An overactive thyroid gland (hyperthyroidism). ?Other sleep disorders, such as restless legs syndrome and sleep apnea. ?Menopause. ?Sometimes, the cause of insomnia may not be known. ?What increases the risk? ?Risk factors for insomnia include: ?Gender. Females are affected more often than males. ?Age. Insomnia is more common as people get older. ?Stress and certain medical and mental health conditions. ?Lack of exercise. ?Having an irregular work schedule. This may include working night shifts and traveling between different time zones. ?What are the signs or symptoms? ?If you have insomnia, the main symptom is having trouble falling asleep or having trouble staying asleep. This may lead to other symptoms, such as: ?Feeling tired or having low energy. ?Feeling nervous about going to sleep. ?Not feeling rested in the  morning. ?Having trouble concentrating. ?Feeling irritable, anxious, or depressed. ?How is this diagnosed? ?This condition may be diagnosed based on: ?Your symptoms and medical history. Your health care provider may ask about: ?Your sleep habits. ?Any medical conditions you have. ?Your mental health. ?A physical exam. ?How is this treated? ?Treatment for insomnia depends on the cause. Treatment may focus on treating an underlying condition that is causing the insomnia. Treatment may also include: ?Medicines to help you sleep. ?Counseling or therapy. ?Lifestyle adjustments to help you sleep better. ?Follow these instructions at home: ?Eating and drinking ? ?Limit or avoid alcohol, caffeinated beverages, and products that contain nicotine and tobacco, especially close to bedtime. These can disrupt your sleep. ?Do not eat a large meal or eat spicy foods right before bedtime. This can lead to digestive discomfort that can make it hard for you to sleep. ?Sleep habits ? ?Keep a sleep diary to help you and your health care provider figure out what could be causing your insomnia. Write down: ?When you sleep. ?When you wake up during the night. ?How well you sleep and how rested you feel the next day. ?Any side effects of medicines you are taking. ?What you eat and drink. ?Make your bedroom a dark, comfortable place where it is easy to fall asleep. ?Put up shades or blackout curtains to block light from outside. ?Use a white noise machine to block noise. ?Keep the temperature cool. ?Limit screen use before bedtime. This includes: ?Not watching TV. ?Not using your smartphone, tablet, or computer. ?Stick to a routine that includes going to bed and waking up at the same times every day and night. This can help you fall asleep faster. Consider making a quiet activity, such as reading, part of your nighttime routine. ?Try to avoid taking  naps during the day so that you sleep better at night. ?Get out of bed if you are still awake  after 15 minutes of trying to sleep. Keep the lights down, but try reading or doing a quiet activity. When you feel sleepy, go back to bed. ?General instructions ?Take over-the-counter and prescription medicines only as told by your health care provider. ?Exercise regularly as told by your health care provider. However, avoid exercising in the hours right before bedtime. ?Use relaxation techniques to manage stress. Ask your health care provider to suggest some techniques that may work well for you. These may include: ?Breathing exercises. ?Routines to release muscle tension. ?Visualizing peaceful scenes. ?Make sure that you drive carefully. Do not drive if you feel very sleepy. ?Keep all follow-up visits. This is important. ?Contact a health care provider if: ?You are tired throughout the day. ?You have trouble in your daily routine due to sleepiness. ?You continue to have sleep problems, or your sleep problems get worse. ?Get help right away if: ?You have thoughts about hurting yourself or someone else. ?Get help right away if you feel like you may hurt yourself or others, or have thoughts about taking your own life. Go to your nearest emergency room or: ?Call 911. ?Call the [facility] at [phone] or [phone]. This is open 24 hours a day. ?Text the Crisis Text Line at (403)690-3831. ?Summary ?Insomnia is a sleep disorder that makes it difficult to fall asleep or stay asleep. ?Insomnia can be long-term (chronic) or short-term (acute). ?Treatment for insomnia depends on the cause. Treatment may focus on treating an underlying condition that is causing the insomnia. ?Keep a sleep diary to help you and your health care provider figure out what could be causing your insomnia. ?This information is not intended to replace advice given to you by your health care provider. Make sure you discuss any questions you have with your health care provider. ?Document Revised: 03/18/2021 Document Reviewed:  03/18/2021 ?Elsevier Patient Education ? [place]. ? ? ?

## 2021-09-10 ENCOUNTER — Ambulatory Visit: Payer: 59 | Admitting: Family Medicine

## 2021-09-10 NOTE — Progress Notes (Deleted)
   Established Patient Office Visit  Subjective   Patient ID: Joann Haynes, female    DOB: 1971/01/17  Age: 51 y.o. MRN: [id number]  No chief complaint on file.   HPI  4-week follow-up for insomnia.  {History (Optional):23778}  ROS    Objective:     There were no vitals taken for this visit. {Vitals History (Optional):23777}  Physical Exam   No results found for any visits on 09/10/21.  {Labs (Optional):23779}  The ASCVD Risk score (Arnett DK, et al., 2019) failed to calculate for the following reasons:   Cannot find a previous HDL lab   Cannot find a previous total cholesterol lab    Assessment & Plan:   Problem List Items Addressed This Visit       Other   Insomnia - Primary    No follow-ups on file.    Beatrice Lecher, MD

## 2021-10-01 ENCOUNTER — Ambulatory Visit: Payer: 59 | Admitting: Family Medicine

## 2021-10-17 ENCOUNTER — Ambulatory Visit (INDEPENDENT_AMBULATORY_CARE_PROVIDER_SITE_OTHER): Payer: 59 | Admitting: Family Medicine

## 2021-10-17 ENCOUNTER — Encounter: Payer: Self-pay | Admitting: Family Medicine

## 2021-10-17 VITALS — BP 114/51 | HR 95 | Temp 101.4°F | Ht 66.0 in | Wt 114.0 lb

## 2021-10-17 DIAGNOSIS — G4700 Insomnia, unspecified: Secondary | ICD-10-CM

## 2021-10-17 DIAGNOSIS — J029 Acute pharyngitis, unspecified: Secondary | ICD-10-CM

## 2021-10-17 LAB — POCT RAPID STREP A (OFFICE): Rapid Strep A Screen: POSITIVE — AB

## 2021-10-17 MED ORDER — PENICILLIN V POTASSIUM 500 MG PO TABS: 500.0000 mg | ORAL_TABLET | Freq: Two times a day (BID) | ORAL | 0 refills | Status: AC

## 2021-10-17 MED ORDER — PENICILLIN V POTASSIUM 500 MG PO TABS: 500.0000 mg | ORAL_TABLET | Freq: Two times a day (BID) | ORAL | 0 refills | Status: DC

## 2021-10-17 MED ORDER — BELSOMRA 10 MG PO TABS
10.0000 mg | ORAL_TABLET | Freq: Every day | ORAL | 0 refills | Status: DC
Start: 2021-10-17 — End: 2021-12-19

## 2021-10-17 NOTE — Progress Notes (Signed)
Established Patient Office Visit  Subjective   Patient ID: Joann Haynes, female    DOB: 1970/11/26  Age: 51 y.o. MRN: [id number]  Chief Complaint  Patient presents with   Insomnia   Sore Throat    HPI  ST and fever x 2 days. Fever to 101.  No cough or ear pain.    Wanted to speak about insomnia.  This has been a chronic problem.  More recently she has been using CBD Gummies to help and Hello disease.  In the mornings she has been taking some herbal serotonin that she gets from the local herbal store.  She says she thinks it helps some but not significantly.  She has noticed that when she exercise regularly that definitely helps.    ROS    Objective:     BP (!) 114/51   Pulse 95   Temp (!) 101.4 F (38.6 C)   Ht '5\' 6"'$  (1.676 m)   Wt 114 lb (51.7 kg)   SpO2 99%   BMI 18.40 kg/m    Physical Exam Constitutional:      Appearance: She is well-developed.  HENT:     Head: Normocephalic and atraumatic.     Right Ear: External ear normal.     Left Ear: External ear normal.     Nose: Nose normal.     Mouth/Throat:     Pharynx: Posterior oropharyngeal erythema present. No pharyngeal swelling or oropharyngeal exudate.  Eyes:     Conjunctiva/sclera: Conjunctivae normal.     Pupils: Pupils are equal, round, and reactive to light.  Neck:     Thyroid: No thyromegaly.  Cardiovascular:     Rate and Rhythm: Normal rate and regular rhythm.     Heart sounds: Normal heart sounds.  Pulmonary:     Effort: Pulmonary effort is normal.     Breath sounds: Normal breath sounds. No wheezing.  Musculoskeletal:     Cervical back: Neck supple.  Lymphadenopathy:     Cervical: No cervical adenopathy.  Skin:    General: Skin is warm and dry.  Neurological:     Mental Status: She is alert and oriented to person, place, and time.      Results for orders placed or performed in visit on 10/17/21  POCT rapid strep A  Result Value Ref Range   Rapid Strep A Screen Positive (A) Negative       The ASCVD Risk score (Arnett DK, et al., 2019) failed to calculate for the following reasons:   Cannot find a previous HDL lab   Cannot find a previous total cholesterol lab    Assessment & Plan:   Problem List Items Addressed This Visit       Other   Insomnia - Primary    Discussed options.  If she wants to stick with her CBD Gummies she can.  She says she thinks it helps just a little but not significantly so we also discussed the option of Belsomra I think she would be a good candidate for it has a pretty clean side effect profile.  Printed prescription for 10 tabs that she can use the coupon card and see if she feels like it is helpful we can always adjust the dose as needed.  She will need to hold the CBD while using this medication.      Relevant Medications   Suvorexant (BELSOMRA) 10 MG TABS   Other Visit Diagnoses     Sore throat  Relevant Orders   POCT rapid strep A (Completed)      Sore throat with fever-we will check for strep throat.  She does work in a daycare setting.  Rapid strep positive will treat with Pen-Vee K.  Call if not better in 1 week.  Note provided.  Return in about 2 months (around 12/17/2021) for sleep .    Beatrice Lecher, MD

## 2021-10-17 NOTE — Addendum Note (Signed)
Addended by: Teddy Spike on: 10/17/2021 09:06 AM   Modules accepted: Orders

## 2021-10-17 NOTE — Progress Notes (Signed)
Pt stated that she needed her prescriptions to go to [facility] on [address]. Called [facility] on [address] spoke to [place] and he cancelled the Penicillin.

## 2021-10-17 NOTE — Assessment & Plan Note (Signed)
Discussed options.  If she wants to stick with her CBD Gummies she can.  She says she thinks it helps just a little but not significantly so we also discussed the option of Belsomra I think she would be a good candidate for it has a pretty clean side effect profile.  Printed prescription for 10 tabs that she can use the coupon card and see if she feels like it is helpful we can always adjust the dose as needed.  She will need to hold the CBD while using this medication.

## 2021-10-28 ENCOUNTER — Telehealth: Payer: Self-pay | Admitting: Family Medicine

## 2021-10-28 DIAGNOSIS — F341 Dysthymic disorder: Secondary | ICD-10-CM

## 2021-10-28 MED ORDER — FLUOXETINE HCL 10 MG PO CAPS: 10.0000 mg | ORAL_CAPSULE | Freq: Every day | ORAL | 1 refills | Status: DC

## 2021-10-28 NOTE — Telephone Encounter (Signed)
Pt called. She wants to get back o her Fluoxetine. She saw Dr Madilyn Fireman last week in June.

## 2021-10-28 NOTE — Telephone Encounter (Signed)
LVM informing pt of RX.  Charyl Bigger, CMA

## 2021-10-28 NOTE — Telephone Encounter (Signed)
Recent medication.  Meds ordered this encounter  Medications   FLUoxetine (PROZAC) 10 MG capsule    Sig: Take 1 capsule (10 mg total) by mouth daily.    Dispense:  90 capsule    Refill:  1

## 2021-11-07 DIAGNOSIS — D2330 Other benign neoplasm of skin of unspecified part of face: Secondary | ICD-10-CM | POA: Diagnosis not present

## 2021-11-07 DIAGNOSIS — X32XXXS Exposure to sunlight, sequela: Secondary | ICD-10-CM | POA: Diagnosis not present

## 2021-11-07 DIAGNOSIS — B353 Tinea pedis: Secondary | ICD-10-CM | POA: Diagnosis not present

## 2021-11-07 DIAGNOSIS — L814 Other melanin hyperpigmentation: Secondary | ICD-10-CM | POA: Diagnosis not present

## 2021-11-07 DIAGNOSIS — L821 Other seborrheic keratosis: Secondary | ICD-10-CM | POA: Diagnosis not present

## 2021-12-19 ENCOUNTER — Encounter: Payer: Self-pay | Admitting: Family Medicine

## 2021-12-19 ENCOUNTER — Ambulatory Visit (INDEPENDENT_AMBULATORY_CARE_PROVIDER_SITE_OTHER): Payer: 59 | Admitting: Family Medicine

## 2021-12-19 VITALS — BP 100/54 | HR 60 | Ht 66.0 in | Wt 115.0 lb

## 2021-12-19 DIAGNOSIS — F341 Dysthymic disorder: Secondary | ICD-10-CM

## 2021-12-19 DIAGNOSIS — M25512 Pain in left shoulder: Secondary | ICD-10-CM

## 2021-12-19 DIAGNOSIS — Z23 Encounter for immunization: Secondary | ICD-10-CM | POA: Diagnosis not present

## 2021-12-19 DIAGNOSIS — F5101 Primary insomnia: Secondary | ICD-10-CM | POA: Diagnosis not present

## 2021-12-19 DIAGNOSIS — R69 Illness, unspecified: Secondary | ICD-10-CM | POA: Diagnosis not present

## 2021-12-19 NOTE — Progress Notes (Signed)
Established Patient Office Visit  Subjective   Patient ID: Joann Haynes, female    DOB: 1970-08-13  Age: 51 y.o. MRN: [id number]  Chief Complaint  Patient presents with   Follow-up    HPI  F/U insomnia -she felt like the Belsomra caused side effects so she discontinued it.  She says overall her sleep in general is a little better than it was when I saw her last month but she still having some nights where it is quite disruptive.  Most of the time it is because she is lying there worrying about the next day.  She is not actually taking her fluoxetine.  She says in fact the last time she actually took the medication was probably 6 months ago.  Has been taking some supplements including a CBD gummy and melatonin to help with her sleep.  Also complains of left shoulder pain -she says she is noticing that when she tries to do her stretches and she tries to extend her arm backwards it is sore and it does not reach as far back as her other shoulder.  She said she did have an old injury to that shoulder years ago when she fell but no fracture.    ROS    Objective:     BP (!) 100/54   Pulse 60   Ht '5\' 6"'$  (1.676 m)   Wt 115 lb (52.2 kg)   SpO2 100%   BMI 18.56 kg/m    Physical Exam Vitals and nursing note reviewed.  Constitutional:      Appearance: She is well-developed.  HENT:     Head: Normocephalic and atraumatic.  Cardiovascular:     Rate and Rhythm: Normal rate and regular rhythm.     Heart sounds: Normal heart sounds.  Pulmonary:     Effort: Pulmonary effort is normal.     Breath sounds: Normal breath sounds.  Musculoskeletal:     Comments: Left shoulder with slightly decreased external rotation maybe about 5 degrees difference between her right and her left.  Otherwise normal extension and flexion.  Negative empty can test.  Skin:    General: Skin is warm and dry.  Neurological:     Mental Status: She is alert and oriented to person, place, and time.  Psychiatric:         Behavior: Behavior normal.     No results found for any visits on 12/19/21.    The ASCVD Risk score (Arnett DK, et al., 2019) failed to calculate for the following reasons:   Cannot find a previous HDL lab   Cannot find a previous total cholesterol lab    Assessment & Plan:   Problem List Items Addressed This Visit       Other   Insomnia - Primary    dicussed options.  We really need to address the stress and anxiety before really be able to get her sleep significantly better.  That was somewhat improved.  We did discuss possible trial of trazodone if at some point she would like to try something different and is not interested in restarting the fluoxetine.  She will let me know if she would like to do so.      ANXIETY DEPRESSION    Overall she has had a better month this month.  She would like to continue with her current supplements including the CBD Gummies and melatonin.  We also discussed putting a lot of stress on making some lifestyle changes to really address her  stress levels which seems to be the biggest factor in her poor sleep quality.  She said she did hear from behavioral health and completed the forms and sent them back in but did not hear back.  Strongly encouraged her to give them a call and follow back up with them to get scheduled.  I think working with a therapist or counselor to improve her overall anxiety and stress will in turn significantly improve her sleep quality as well.      Other Visit Diagnoses     Flu vaccine need       Relevant Orders   Flu Vaccine QUAD 83moIM (Fluarix, Fluzone & Alfiuria Quad PF) (Completed)   Acute pain of left shoulder          Left shoulder pain-rotator cuff is intact on exam but I did give her some exercises to do on her own at home.  If not improving or noticing worsening pain or decreased range of motion that encouraged her to schedule an appointment with our sports medicine doctor.  Recent trauma or injury.  It does  not bother her with most activities only when she is really extending that shoulder back  Return in about 3 months (around 03/20/2022) for mood adn sleep .    CBeatrice Lecher MD

## 2021-12-19 NOTE — Assessment & Plan Note (Addendum)
dicussed options.  We really need to address the stress and anxiety before really be able to get her sleep significantly better.  That was somewhat improved.  We did discuss possible trial of trazodone if at some point she would like to try something different and is not interested in restarting the fluoxetine.  She will let me know if she would like to do so.

## 2021-12-19 NOTE — Assessment & Plan Note (Signed)
Overall she has had a better month this month.  She would like to continue with her current supplements including the CBD Gummies and melatonin.  We also discussed putting a lot of stress on making some lifestyle changes to really address her stress levels which seems to be the biggest factor in her poor sleep quality.  She said she did hear from behavioral health and completed the forms and sent them back in but did not hear back.  Strongly encouraged her to give them a call and follow back up with them to get scheduled.  I think working with a therapist or counselor to improve her overall anxiety and stress will in turn significantly improve her sleep quality as well.

## 2022-01-22 ENCOUNTER — Encounter: Payer: Self-pay | Admitting: Family Medicine

## 2022-01-23 NOTE — Telephone Encounter (Signed)
OK to use the doxylamine with the prozac. Does she need new rx for prozac?

## 2022-01-30 ENCOUNTER — Encounter: Payer: Self-pay | Admitting: Family Medicine

## 2022-02-02 ENCOUNTER — Encounter: Payer: Self-pay | Admitting: Family Medicine

## 2022-02-03 MED ORDER — HYDROXYZINE PAMOATE 25 MG PO CAPS: 25.0000 mg | ORAL_CAPSULE | Freq: Every evening | ORAL | 0 refills | Status: DC | PRN

## 2022-02-03 NOTE — Telephone Encounter (Signed)
Meds ordered this encounter  Medications   hydrOXYzine (VISTARIL) 25 MG capsule    Sig: Take 1 capsule (25 mg total) by mouth at bedtime as needed.    Dispense:  30 capsule    Refill:  0   Also please see additional myChart.  I can provide work note for those days until can pick up the vistaril.  I don't think she needs disability. Is she talking about FMLA maybe?

## 2022-02-04 ENCOUNTER — Encounter: Payer: Self-pay | Admitting: Family Medicine

## 2022-02-04 ENCOUNTER — Ambulatory Visit (INDEPENDENT_AMBULATORY_CARE_PROVIDER_SITE_OTHER): Payer: 59 | Admitting: Family Medicine

## 2022-02-04 ENCOUNTER — Telehealth: Payer: Self-pay | Admitting: Family Medicine

## 2022-02-04 VITALS — BP 102/58 | HR 66 | Ht 66.0 in | Wt 116.0 lb

## 2022-02-04 DIAGNOSIS — F341 Dysthymic disorder: Secondary | ICD-10-CM

## 2022-02-04 DIAGNOSIS — Z780 Asymptomatic menopausal state: Secondary | ICD-10-CM | POA: Diagnosis not present

## 2022-02-04 DIAGNOSIS — R69 Illness, unspecified: Secondary | ICD-10-CM | POA: Diagnosis not present

## 2022-02-04 DIAGNOSIS — F5101 Primary insomnia: Secondary | ICD-10-CM

## 2022-02-04 MED ORDER — FLUOXETINE HCL 10 MG PO TABS: 5.0000 mg | ORAL_TABLET | Freq: Every day | ORAL | 0 refills | Status: DC

## 2022-02-04 MED ORDER — ESZOPICLONE 1 MG PO TABS: 1.0000 mg | ORAL_TABLET | Freq: Every evening | ORAL | 0 refills | Status: DC | PRN

## 2022-02-04 NOTE — Telephone Encounter (Signed)
Patient called states still not sleeping, crying a lot she would like a call to talk to someone she is scheduled on 03-20-22.  [phone]

## 2022-02-04 NOTE — Progress Notes (Signed)
Acute Office Visit  Subjective:     Patient ID: Joann Haynes, female    DOB: 16-Apr-1971, 51 y.o.   MRN: [id number]  Chief Complaint  Patient presents with   Follow-up    HPI Patient is in today for depression and insomnia.  She feels like she is really been struggling for 6 months.  She had reached out over MyChart about a week and a half ago and she was taking 8 supplements including CBD and THC.  She wanted to know if she should restart the fluoxetine so encouraged her to restart it.  She had been on fluoxetine for several years until recently.  Restarting the 10 mg fluoxetine she has had more sleep disruption so she ended up stopping it.  She said she had her last period about 4 months ago so she feels like some of this is probably menopause triggered.  She says she is getting to the point where she is so anxious about sleep and not sleeping that she is obsessing about foods that she is eating and when to exercise that she has been trying to exercise.  She has tried Belsomra in the past but says it made her "feel "weird".  She missed 2 days of work about 2 weeks ago and then had to go home early 1 day last week because she just felt so exhausted she only slept maybe 1 hour.  And she says she just feels extremely sensitive to that lack of sleep.  ROS      Objective:    BP (!) 102/58   Pulse 66   Ht '5\' 6"'$  (1.676 m)   Wt 116 lb (52.6 kg)   SpO2 99%   BMI 18.72 kg/m    Physical Exam Vitals reviewed.  Constitutional:      Appearance: She is well-developed.  HENT:     Head: Normocephalic and atraumatic.  Eyes:     Conjunctiva/sclera: Conjunctivae normal.  Cardiovascular:     Rate and Rhythm: Normal rate.  Pulmonary:     Effort: Pulmonary effort is normal.  Skin:    General: Skin is dry.     Coloration: Skin is not pale.  Neurological:     Mental Status: She is alert and oriented to person, place, and time.  Psychiatric:        Behavior: Behavior normal.     No  results found for any visits on 02/04/22.      Assessment & Plan:   Problem List Items Addressed This Visit       Other   Menopause    Do think some of her menopause symptoms could be contributing to the sleep quality and the bump up in anxiety symptoms.  She is not currently having frequent hot flashes which is reassuring.  We did discuss that SSRIs actually can be helpful with some of the menopausal symptoms as well.      Insomnia - Primary    Discussed options. Will try lunesta.       ANXIETY DEPRESSION    Is really struggling with her mood right now.  It sounds like it is really been a bit of a struggle for about the last 6 months.  I really do not think the fluoxetine at 10 mg is causing significant insomnia.  She was on it for years and did well and slept well at that time.  I think it is probably really more of a combination of the menopause and not Welke uncontrolled  anxiety.  She does not feel well supported at her current job either.  We did send over a lower dose of 5 mg to take on the fluoxetine earlier today.  We sent over the 10 mg tabs says she can split them in half and start with a half a tab for the first week or 2 and then go up to a whole tab.  Given work note for the next 2 weeks until we can get her sleep disruption improved and work on her mood.  She is open to working with a English as a second language teacher.  We had previously sent a referral out to the mood treatment center but they were booked out quite a ways and did not have anybody available soon to help her.  We will place new referral today.      Relevant Orders   Ambulatory referral to [facility] ordered this encounter  Medications   eszopiclone (LUNESTA) 1 MG TABS tablet    Sig: Take 1 tablet (1 mg total) by mouth at bedtime as needed for sleep. Take immediately before bedtime    Dispense:  30 tablet    Refill:  0    Return in about 10 days (around 02/14/2022) for New start medication, sleep  .  Beatrice Lecher, MD

## 2022-02-04 NOTE — Telephone Encounter (Signed)
Patient called and finished on patient calls note.

## 2022-02-04 NOTE — Telephone Encounter (Signed)
She states she will start the hydroxyzine tonight along with half 10 mg tablet of the fluoxitine.   Fluoxitine 10 mg tablets sent to pharmacy.   She will call back to schedule a follow up.

## 2022-02-04 NOTE — Assessment & Plan Note (Signed)
Discussed options. Will try lunesta.

## 2022-02-04 NOTE — Assessment & Plan Note (Addendum)
Is really struggling with her mood right now.  It sounds like it is really been a bit of a struggle for about the last 6 months.  I really do not think the fluoxetine at 10 mg is causing significant insomnia.  She was on it for years and did well and slept well at that time.  I think it is probably really more of a combination of the menopause and not Welke uncontrolled anxiety.  She does not feel well supported at her current job either.  We did send over a lower dose of 5 mg to take on the fluoxetine earlier today.  We sent over the 10 mg tabs says she can split them in half and start with a half a tab for the first week or 2 and then go up to a whole tab.  Given work note for the next 2 weeks until we can get her sleep disruption improved and work on her mood.  She is open to working with a English as a second language teacher.  We had previously sent a referral out to the mood treatment center but they were booked out quite a ways and did not have anybody available soon to help her.  We will place new referral today.

## 2022-02-04 NOTE — Addendum Note (Signed)
Addended by: Narda Rutherford on: 02/04/2022 01:29 PM   Modules accepted: Orders

## 2022-02-04 NOTE — Assessment & Plan Note (Signed)
Do think some of her menopause symptoms could be contributing to the sleep quality and the bump up in anxiety symptoms.  She is not currently having frequent hot flashes which is reassuring.  We did discuss that SSRIs actually can be helpful with some of the menopausal symptoms as well.

## 2022-02-04 NOTE — Telephone Encounter (Signed)
Please call her and see what is going on also please verify what dose of medication she is taking.  I know she restarted which she already had at home which is why we do not have it on her medication list.  I had previously sent a MyChart yesterday saying to split the tab in half because she felt like it was causing some sleep disruption and I just sent over a new prescription for hydroxyzine for sleep yesterday.

## 2022-02-05 ENCOUNTER — Telehealth: Payer: Self-pay

## 2022-02-05 NOTE — Telephone Encounter (Signed)
Initiated Prior authorization [id number] '1MG'$  tablets Via: Covermymeds Case/Key: [id number] Status: approved  as of 02/05/22 Reason:dates not given  Notified Pt via: Mychart

## 2022-02-06 ENCOUNTER — Telehealth: Payer: Self-pay | Admitting: *Deleted

## 2022-02-06 NOTE — Telephone Encounter (Signed)
Pt called and LVM stating that her employer doesn't offer FMLA because they have less than 50 employees. She asked if Dr. Madilyn Fireman knew of any other options she could do.  I returned her called and had to LVM informing her that she will need to contact her employer to find out what she would need to do in a situation such as this because I'm not sure if Dr. Madilyn Fireman has knowledge about these types of employer issues. I asked that she call back. But I would fwd this to Dr. Madilyn Fireman to see if she has any ideas about this.

## 2022-02-07 NOTE — Telephone Encounter (Signed)
I am not sure. [place] may have separate laws that cover employers with less than 50 employees.  It may be something she wants to talk with her employer about in regards to options or may want to go online and try to find some information particularly regarding the state.  I am just not sure.  But if the federal law does not apply because of the number of employees then I would look it with the state losses.

## 2022-02-11 NOTE — Telephone Encounter (Signed)
Left a message for a return call.

## 2022-02-13 ENCOUNTER — Encounter: Payer: Self-pay | Admitting: Family Medicine

## 2022-02-13 ENCOUNTER — Ambulatory Visit: Payer: 59 | Admitting: Family Medicine

## 2022-02-13 VITALS — BP 102/60 | HR 72 | Wt 110.0 lb

## 2022-02-13 DIAGNOSIS — F5101 Primary insomnia: Secondary | ICD-10-CM | POA: Diagnosis not present

## 2022-02-13 DIAGNOSIS — R5383 Other fatigue: Secondary | ICD-10-CM | POA: Diagnosis not present

## 2022-02-13 DIAGNOSIS — N912 Amenorrhea, unspecified: Secondary | ICD-10-CM | POA: Diagnosis not present

## 2022-02-13 DIAGNOSIS — F341 Dysthymic disorder: Secondary | ICD-10-CM

## 2022-02-13 DIAGNOSIS — R69 Illness, unspecified: Secondary | ICD-10-CM | POA: Diagnosis not present

## 2022-02-13 DIAGNOSIS — E559 Vitamin D deficiency, unspecified: Secondary | ICD-10-CM | POA: Diagnosis not present

## 2022-02-13 MED ORDER — FLUOXETINE HCL 10 MG PO TABS: 10.0000 mg | ORAL_TABLET | Freq: Every day | ORAL | 1 refills | Status: DC

## 2022-02-13 MED ORDER — ESZOPICLONE 2 MG PO TABS: 2.0000 mg | ORAL_TABLET | Freq: Every evening | ORAL | 1 refills | Status: DC | PRN

## 2022-02-13 NOTE — Progress Notes (Signed)
Established Patient Office Visit  Subjective   Patient ID: Joann Haynes, female    DOB: [birth year]  Age: 51 y.o. MRN: [id number]  Chief Complaint  Patient presents with   Anxiety   Insomnia    HPI  Follow-up depression anxiety and insomnia.  Would send in a prescription for Lunesta 1 mg but it did require prior authorization from her insurance.  This was approved about a week ago.  She had recently restarted fluoxetine 10 mg for recurring depression and anxiety.  After starting the medication it had worsened her insomnia but she had also had insomnia issues for years prior.  So we decided to focus on getting the sleep better while still continuing with the fluoxetine 5 mg.  She did bump up the Lunesta to 2 tabs about 2 days ago and felt like that was working better.  She felt the 1 mg was not really helpful.  Her father is in his home but is actively passing away.  She is expecting a phone call in a day that he has passed.  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  It does not qualify for federally regulated FMLA.  He says she has been trying to go to work.    Having difficulty with urinary frequency.  She did go see a holistic provider and they had recommended that she start a calcium, magnesium, and omega-3 supplement.  She took it at bedtime 2 nights ago but she did not sleep well.  So she is thinking about moving it to the morning but also had questions about whether or not she should take calcium.  She eats a lot of calcium rich foods normally.  I also recommended that she have some additional labs drawn.    ROS    Objective:     BP 102/60   Pulse 72   Wt 110 lb (49.9 kg)   SpO2 100%   BMI 17.75 kg/m    Physical Exam Vitals and nursing note reviewed.  Constitutional:      Appearance: She is well-developed.  HENT:     Head: Normocephalic and atraumatic.  Cardiovascular:     Rate  and Rhythm: Normal rate and regular rhythm.     Heart sounds: Normal heart sounds.  Pulmonary:     Effort: Pulmonary effort is normal.     Breath sounds: Normal breath sounds.  Skin:    General: Skin is warm and dry.  Neurological:     Mental Status: She is alert and oriented to person, place, and time.  Psychiatric:        Behavior: Behavior normal.      No results found for any visits on 02/13/22.    The ASCVD Risk score (Arnett DK, et al., 2019) failed to calculate for the following reasons:   Cannot find a previous HDL lab   Cannot find a previous total cholesterol lab    Assessment & Plan:   Problem List Items Addressed This Visit       Other   Insomnia    Bump Lunesta to 2 mg and have her try that for the next 3 to 4 weeks and see if that is more helpful.  There still may be a few nights where she does not sleep well even with the medication but it should help overall.      Relevant Orders  COMPLETE METABOLIC PANEL WITH GFR   CBC   TSH   Fe+TIBC+Fer   VITAMIN D 25 Hydroxy (Vit-D Deficiency, Fractures)   Follicle stimulating hormone   Luteinizing hormone   Estradiol   Progesterone   B12   ANXIETY DEPRESSION - Primary    Urged her to increase the fluoxetine to 10 mg tab daily.  With the hope of being able to bump her up to 20 mg maybe in 3 to 4 weeks so that we can get better efficacy for anxiety control for her.  We will do some additional labs per her request.  Just to rule out thyroid disorder or underlying deficiencies including B12 D and iron.      Relevant Medications   FLUoxetine (PROZAC) 10 MG tablet   Other Relevant Orders   COMPLETE METABOLIC PANEL WITH GFR   CBC   TSH   Fe+TIBC+Fer   VITAMIN D 25 Hydroxy (Vit-D Deficiency, Fractures)   Follicle stimulating hormone   Luteinizing hormone   Estradiol   Progesterone   B12   Other Visit Diagnoses     Other fatigue       Relevant Orders   COMPLETE METABOLIC PANEL WITH GFR   CBC   TSH    Fe+TIBC+Fer   VITAMIN D 25 Hydroxy (Vit-D Deficiency, Fractures)   Follicle stimulating hormone   Luteinizing hormone   Estradiol   Progesterone   B12   Amenorrhea       Relevant Orders   COMPLETE METABOLIC PANEL WITH GFR   CBC   TSH   Fe+TIBC+Fer   VITAMIN D 25 Hydroxy (Vit-D Deficiency, Fractures)   Follicle stimulating hormone   Luteinizing hormone   Estradiol   Progesterone   B12       Return in about 3 weeks (around 03/06/2022) for New start medication.    Beatrice Lecher, MD

## 2022-02-13 NOTE — Assessment & Plan Note (Signed)
Urged her to increase the fluoxetine to 10 mg tab daily.  With the hope of being able to bump her up to 20 mg maybe in 3 to 4 weeks so that we can get better efficacy for anxiety control for her.  We will do some additional labs per her request.  Just to rule out thyroid disorder or underlying deficiencies including B12 D and iron.

## 2022-02-13 NOTE — Progress Notes (Signed)
Pt stated that she was taking the Lunesta and Melatonin at the same time but decided to stop taking the Melatonin with it because it wasn't as effective and only took the [place] and this worked fine at first but now she's not getting any sleep with just taking the [place].

## 2022-02-13 NOTE — Assessment & Plan Note (Signed)
Bump Lunesta to 2 mg and have her try that for the next 3 to 4 weeks and see if that is more helpful.  There still may be a few nights where she does not sleep well even with the medication but it should help overall.

## 2022-02-14 LAB — PROGESTERONE: Progesterone: <0.5 ng/mL

## 2022-02-14 LAB — FOLLICLE STIMULATING HORMONE: FSH: 111.8 m[IU]/mL

## 2022-02-14 LAB — COMPLETE METABOLIC PANEL WITH GFR
AG Ratio: 2.1 (calc) (ref 1.0–2.5)
ALT: 16 U/L (ref 6–29)
AST: 17 U/L (ref 10–35)
Albumin: 4.8 g/dL (ref 3.6–5.1)
Alkaline phosphatase (APISO): 63 U/L (ref 37–153)
BUN: 19 mg/dL (ref 7–25)
CO2: 31 mmol/L (ref 20–32)
Calcium: 9.5 mg/dL (ref 8.6–10.4)
Chloride: 103 mmol/L (ref 98–110)
Creat: 0.9 mg/dL (ref 0.50–1.03)
Globulin: 2.3 g/dL (calc) (ref 1.9–3.7)
Glucose, Bld: 101 mg/dL — ABNORMAL HIGH (ref 65–99)
Potassium: 4.4 mmol/L (ref 3.5–5.3)
Sodium: 139 mmol/L (ref 135–146)
Total Bilirubin: 0.8 mg/dL (ref 0.2–1.2)
Total Protein: 7.1 g/dL (ref 6.1–8.1)
eGFR: 77 mL/min/{1.73_m2} (ref >=60)

## 2022-02-14 LAB — CBC
HCT: 39.7 % (ref 35.0–45.0)
Hemoglobin: 13.7 g/dL (ref 11.7–15.5)
MCH: 31.6 pg (ref 27.0–33.0)
MCHC: 34.5 g/dL (ref 32.0–36.0)
MCV: 91.5 fL (ref 80.0–100.0)
MPV: 10.6 fL (ref 7.5–12.5)
Platelets: 247 10*3/uL (ref 140–400)
RBC: 4.34 10*6/uL (ref 3.80–5.10)
RDW: 11.8 % (ref 11.0–15.0)
WBC: 7 10*3/uL (ref 3.8–10.8)

## 2022-02-14 LAB — IRON,TIBC AND FERRITIN PANEL
%SAT: 34 % (calc) (ref 16–45)
Ferritin: 54 ng/mL (ref 16–232)
Iron: 96 ug/dL (ref 45–160)
TIBC: 285 mcg/dL (calc) (ref 250–450)

## 2022-02-14 LAB — ESTRADIOL: Estradiol: 42 pg/mL

## 2022-02-14 LAB — VITAMIN B12: Vitamin B-12: 496 pg/mL (ref 200–1100)

## 2022-02-14 LAB — TSH: TSH: 1.69 mIU/L

## 2022-02-14 LAB — LUTEINIZING HORMONE: LH: 38 m[IU]/mL

## 2022-02-14 LAB — VITAMIN D 25 HYDROXY (VIT D DEFICIENCY, FRACTURES): Vit D, 25-Hydroxy: 22 ng/mL — ABNORMAL LOW (ref 30–100)

## 2022-02-14 NOTE — Progress Notes (Signed)
Joann Haynes, your metabolic panel including liver and kidney function look good.  Your vitamin D is low I would recommend starting 25 mcg daily.  Your blood count is normal no sign of anemia.  Thyroid looks great at 1.6.  Hormones look like you are still in that perimenopausal stage.  B12 looks great.  Iron looks great as well.

## 2022-02-17 ENCOUNTER — Telehealth: Payer: Self-pay | Admitting: *Deleted

## 2022-02-17 NOTE — Telephone Encounter (Signed)
Please have her try 3 tabs of the Lunesta. That is the max but may work better for her Ihtnk worth a try at least for a few nights before we give up on it and move to another medication.

## 2022-02-17 NOTE — Telephone Encounter (Signed)
Attempted call to patient. Left voice mail to have patient return our call.

## 2022-02-17 NOTE — Telephone Encounter (Signed)
Pt called and stated that she feels that the Lunesta isn't working for her and she would like to start Trazodone. She states that she is not rested she doesn't feel well and unsure if the Fluoxetine could be counteracting with the Lunesta. She said that she keeps waking up and isn't able to fall back to sleep. Pt advised that Dr. Madilyn Fireman is not in the office today or tomorrow.    Pharmacy confirmed.

## 2022-02-17 NOTE — Progress Notes (Signed)
I think there are mulitple notes on thisw.  Ok to increase to 3 tabs.

## 2022-02-18 NOTE — Telephone Encounter (Signed)
Joann Haynes states there is a quantity limitation on the Lunesta. Can you do a PA for quantity exception?

## 2022-02-18 NOTE — Telephone Encounter (Signed)
Pt stated she tried 3 tabs of Lunesta last night and it did help her some was advise to continue to taking the rest of the week. Call us back to let us know.

## 2022-02-18 NOTE — Telephone Encounter (Signed)
Pt advised of recommendations and voiced understanding.

## 2022-02-19 ENCOUNTER — Telehealth: Payer: Self-pay

## 2022-02-19 MED ORDER — ESZOPICLONE 3 MG PO TABS: 3.0000 mg | ORAL_TABLET | Freq: Every evening | ORAL | 1 refills | Status: DC | PRN

## 2022-02-19 NOTE — Telephone Encounter (Signed)
Her insurance will only pay for 15 tablets for a 30 day supply. A quantity exception is needed.

## 2022-02-19 NOTE — Telephone Encounter (Addendum)
Quantify Limit Exception review ORT:[id number] 3 MG tablets 30/30. Via: Called [facility]  [id number] Status: approved  as of 02/19/22 for 36 month till 03/25/2023  Resolved:Under the plans conditions this pt is covered for 36 months  pt has been approved  for Eszopiclone 3 MG tablets for a 30 for 30 quantity limit, per plan  pt can fill 30 tabs  for 25 days or 90 per 75 days the pt has already had a fill of this medication and is advise to reach out to pharmacy  for her next fill date.  Notified Pt via: Mychart

## 2022-02-19 NOTE — Telephone Encounter (Signed)
Erroneous error

## 2022-02-19 NOTE — Telephone Encounter (Signed)
Sent new rx for '3mg'$  tab to take one a day  Meds ordered this encounter  Medications   eszopiclone 3 MG TABS    Sig: Take 1 tablet (3 mg total) by mouth at bedtime as needed for sleep. Take immediately before bedtime    Dispense:  30 tablet    Refill:  1

## 2022-02-20 ENCOUNTER — Other Ambulatory Visit: Payer: Self-pay | Admitting: *Deleted

## 2022-02-20 ENCOUNTER — Telehealth: Payer: Self-pay | Admitting: Family Medicine

## 2022-02-20 DIAGNOSIS — F5101 Primary insomnia: Secondary | ICD-10-CM

## 2022-02-20 NOTE — Telephone Encounter (Signed)
Patient stated she called at 9 AM on 11/02 and asked to speak to Dr. Madilyn Fireman or CMA and was not called back. Patient called again to request a phone call, would not specify the reasoning. Documenting per [employer]

## 2022-02-24 NOTE — Telephone Encounter (Signed)
Spoke w/pt she wanted a referral. Referral was placed.

## 2022-02-26 ENCOUNTER — Ambulatory Visit: Payer: 59 | Admitting: Family Medicine

## 2022-02-26 ENCOUNTER — Encounter: Payer: Self-pay | Admitting: Family Medicine

## 2022-02-26 VITALS — BP 124/58 | HR 78 | Ht 66.0 in | Wt 109.0 lb

## 2022-02-26 DIAGNOSIS — F341 Dysthymic disorder: Secondary | ICD-10-CM | POA: Diagnosis not present

## 2022-02-26 DIAGNOSIS — F4321 Adjustment disorder with depressed mood: Secondary | ICD-10-CM | POA: Diagnosis not present

## 2022-02-26 DIAGNOSIS — Z23 Encounter for immunization: Secondary | ICD-10-CM

## 2022-02-26 DIAGNOSIS — F5101 Primary insomnia: Secondary | ICD-10-CM | POA: Diagnosis not present

## 2022-02-26 DIAGNOSIS — R69 Illness, unspecified: Secondary | ICD-10-CM | POA: Diagnosis not present

## 2022-02-26 MED ORDER — QUETIAPINE FUMARATE 25 MG PO TABS: 25.0000 mg | ORAL_TABLET | Freq: Every day | ORAL | 1 refills | Status: DC

## 2022-02-26 NOTE — Progress Notes (Signed)
Established Patient Office Visit  Subjective   Patient ID: Joann Haynes, female    DOB: [birth year]  Age: 51 y.o. MRN: [id number]  Chief Complaint  Patient presents with   Insomnia         HPI  Follow-up insomnia-currently on Lunesta 2 mg daily.  There was some confusion about taking the tabs and at 1 point she was taking 3 of the 2 mg for a total of 6 mg but it caused upset stomach and just made her feel really sedated and lethargic.  She is back down to just the 2 mg tab.  She has not picked up the 3 mg dose yet.  Follow-up anxiety/depression-urged her to increase her fluoxetine to 10 mg when I saw her last time.  She had taken this medication previously.  Not sure if she feels a little bit more down.  Her father did pass away on October 27.  He had advanced dementia.  She is out of work until November 13.  He does have an appointment with Francie Massing on December 21.      ROS    Objective:     BP (!) 124/58   Pulse 78   Ht '5\' 6"'$  (1.676 m)   Wt 109 lb (49.4 kg)   SpO2 100%   BMI 17.59 kg/m    Physical Exam Vitals and nursing note reviewed.  Constitutional:      Appearance: She is well-developed.  HENT:     Head: Normocephalic and atraumatic.  Cardiovascular:     Rate and Rhythm: Normal rate and regular rhythm.     Heart sounds: Normal heart sounds.  Pulmonary:     Effort: Pulmonary effort is normal.     Breath sounds: Normal breath sounds.  Skin:    General: Skin is warm and dry.  Neurological:     Mental Status: She is alert and oriented to person, place, and time.  Psychiatric:        Behavior: Behavior normal.     No results found for any visits on 02/26/22.    The ASCVD Risk score (Arnett DK, et al., 2019) failed to calculate for the following reasons:   Cannot find a previous HDL lab   Cannot find a previous total cholesterol lab    Assessment & Plan:   Problem List Items Addressed This Visit       Other   Insomnia - Primary    Kircher  to increase the Lunesta to 3 mg.  Prescription was sent to the pharmacy about a week ago and should have been approved with her insurance.  Were also going to try low-dose quetiapine as well.  Follow-up in 4 weeks.      Relevant Medications   QUEtiapine (SEROQUEL) 25 MG tablet   ANXIETY DEPRESSION    Discussed options.  I really like for her to try to go up on the fluoxetine.  We will go up to 15 mg for 2 weeks and then go up to 20 mg.  She is very sensitive to medication so definitely want to go slowly.  She does feel like her depression is worse but her father also passed away about 2 weeks ago so I think that could be contributing as well.  If it continues to worsen then we may need to discontinue the fluoxetine.  We also discussed adding a low-dose of quetiapine at bedtime to help with more severe depressive symptoms and with her sleep disruption.  Relevant Medications   QUEtiapine (SEROQUEL) 25 MG tablet   Other Visit Diagnoses     Need for Zostavax administration       Relevant Orders   Zoster Recombinant (Shingrix ) (Completed)   Grief           Return in about 4 weeks (around 03/26/2022) for New start medication.    Beatrice Lecher, MD

## 2022-02-26 NOTE — Patient Instructions (Signed)
Increase fluoxetine to 1-1/2 tabs daily for 14 days Then increase to 2 tabs daily for a total of 20 mg.  He started to get low your medication please let me know and I can send over a new prescription for the 20 mg so you do not have to take multiples. Please take the quetiapine in the evening or close to bedtime.  This should help with low mood as well as sleep.  It is okay to take this with the [place]. Lease pick up the Lunesta 3 mg and start that and see how you feel on it.

## 2022-02-26 NOTE — Assessment & Plan Note (Signed)
Discussed options.  I really like for her to try to go up on the fluoxetine.  We will go up to 15 mg for 2 weeks and then go up to 20 mg.  She is very sensitive to medication so definitely want to go slowly.  She does feel like her depression is worse but her father also passed away about 2 weeks ago so I think that could be contributing as well.  If it continues to worsen then we may need to discontinue the fluoxetine.  We also discussed adding a low-dose of quetiapine at bedtime to help with more severe depressive symptoms and with her sleep disruption.

## 2022-02-26 NOTE — Assessment & Plan Note (Signed)
Kircher to increase the Lunesta to 3 mg.  Prescription was sent to the pharmacy about a week ago and should have been approved with her insurance.  Were also going to try low-dose quetiapine as well.  Follow-up in 4 weeks.

## 2022-02-27 ENCOUNTER — Encounter: Payer: Self-pay | Admitting: Family Medicine

## 2022-02-27 IMAGING — US US PELVIS COMPLETE WITH TRANSVAGINAL
1 series · 13 of 25 positions shown · non-contrast
Comparison: None

CLINICAL DATA: Abnormal uterine bleeding, bleeding between menses;
LMP 11/14/2019

EXAM:
TRANSABDOMINAL AND TRANSVAGINAL ULTRASOUND OF PELVIS
TECHNIQUE: Both transabdominal and transvaginal ultrasound examinations of the
pelvis were performed. Transabdominal technique was performed for
global imaging of the pelvis including uterus, ovaries, adnexal
regions, and pelvic cul-de-sac. It was necessary to proceed with
endovaginal exam following the transabdominal exam to visualize the
endometrium and ovaries.

[Series 1: us pelvis complete with transvaginal · 0.22mm/px · 13 of 134 slices shown]
[im 1/134]
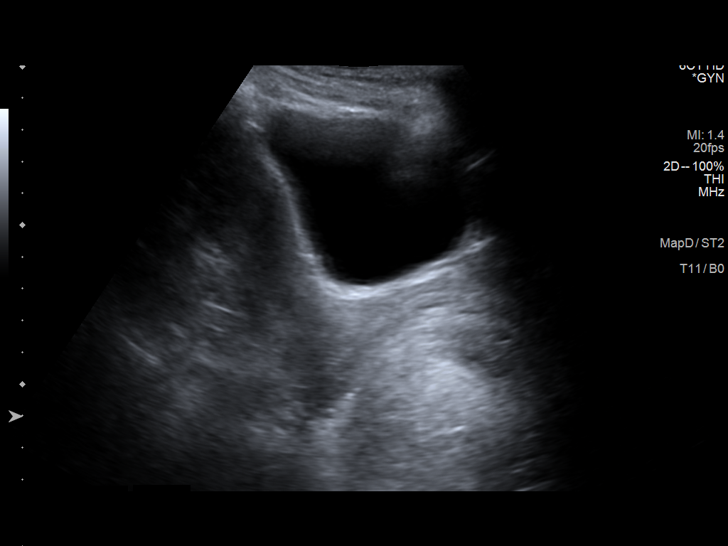
[im 12/134]
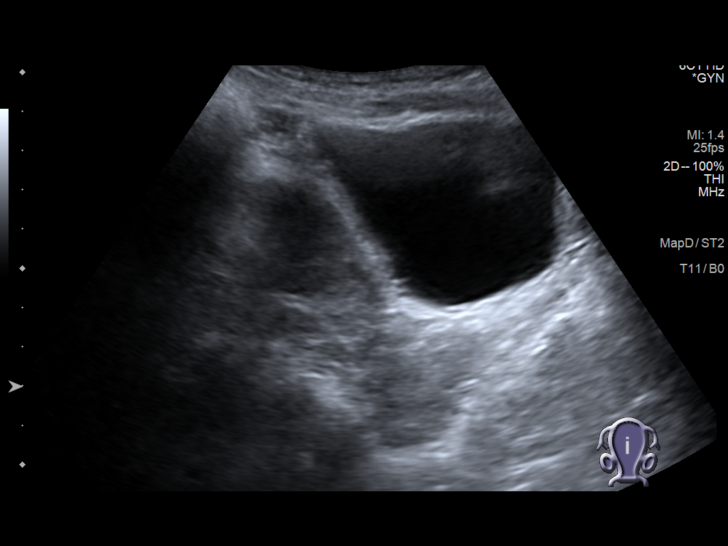
[im 23/134]
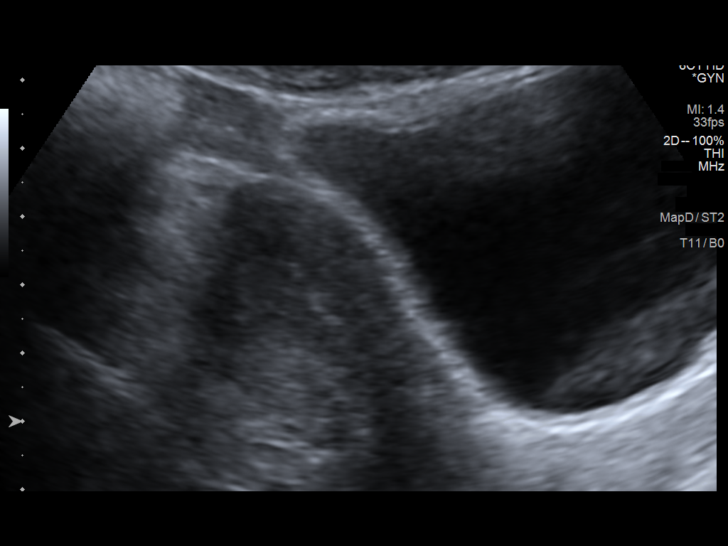
[im 34/134]
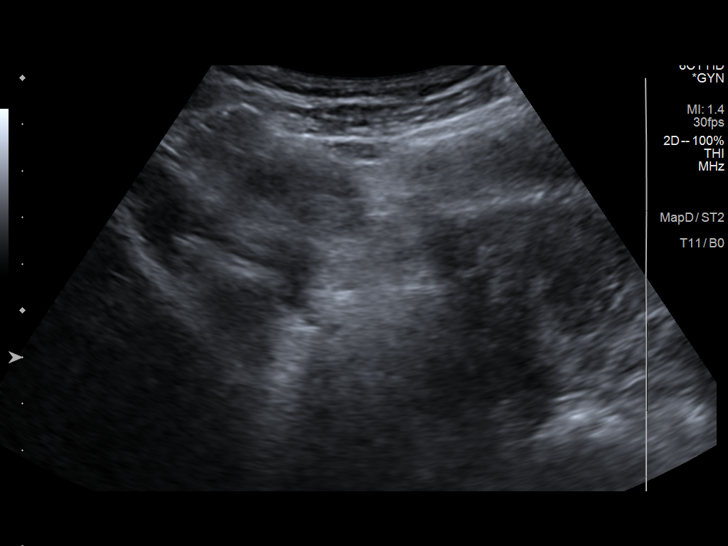
[im 45/134]
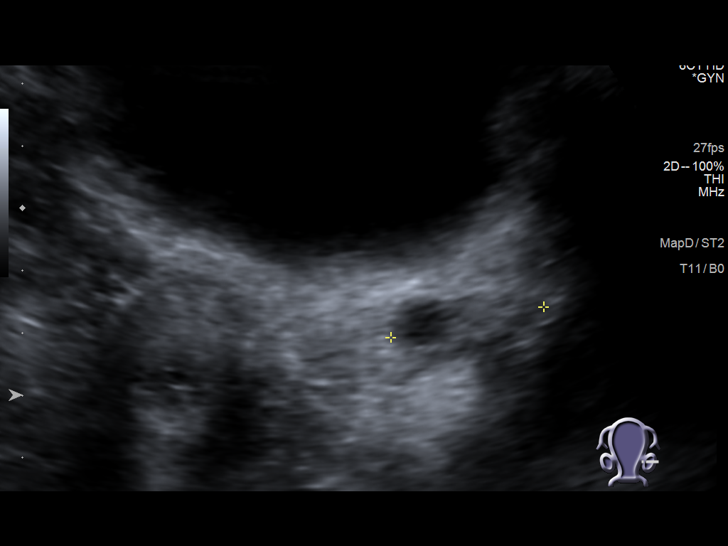
[im 56/134]
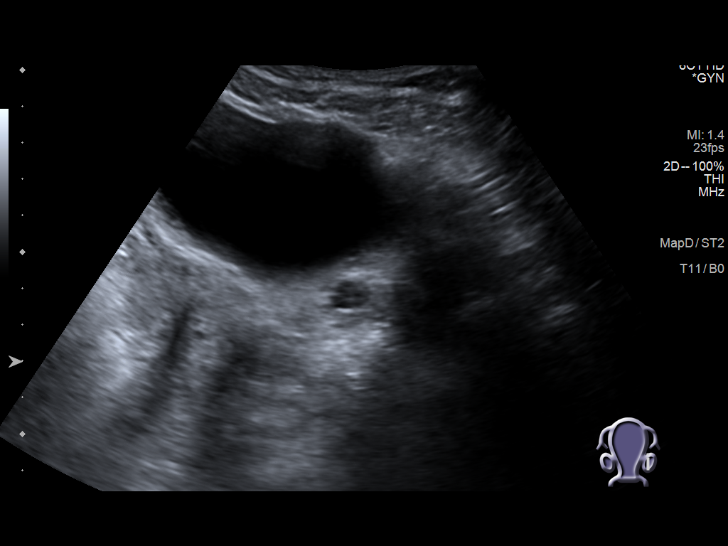
[im 67/134]
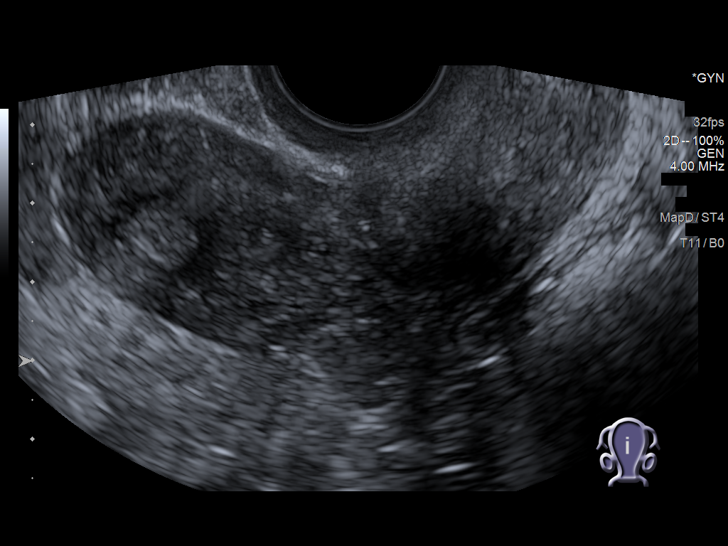
[im 78/134]
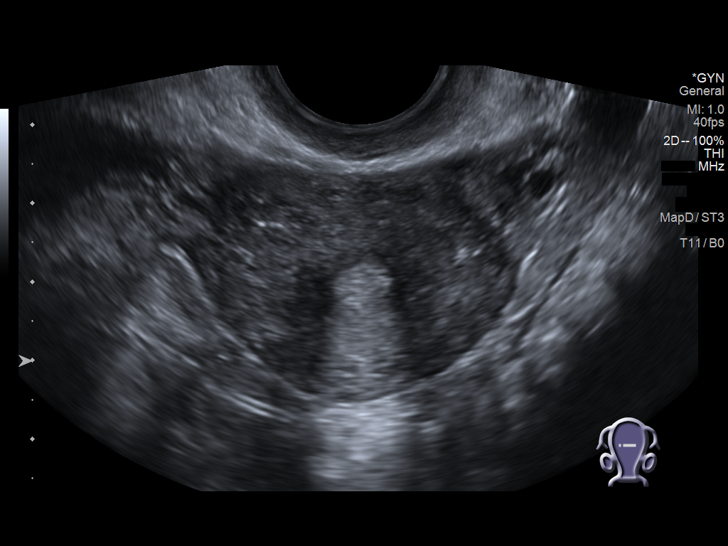
[im 89/134]
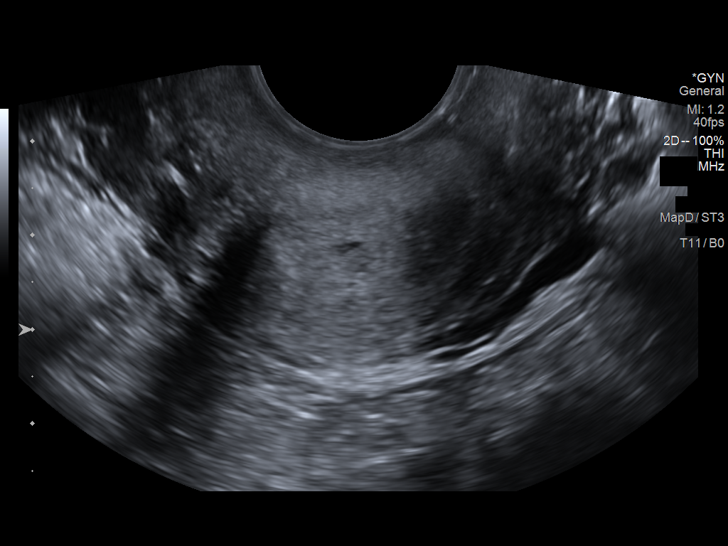
[im 100/134]
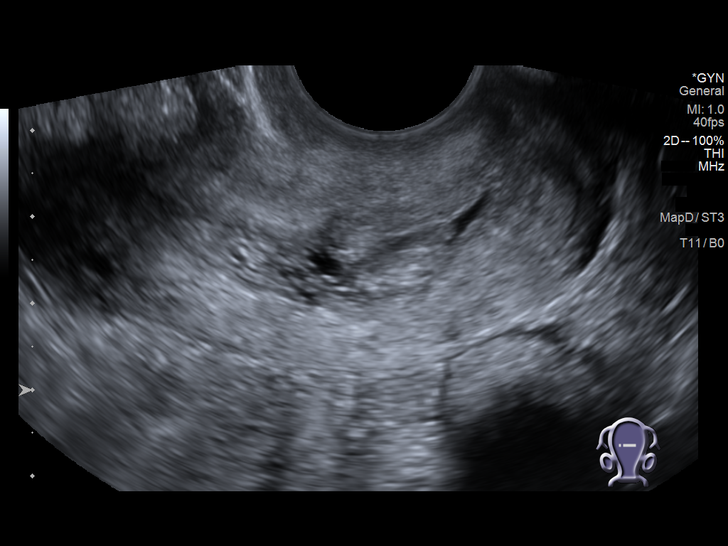
[im 111/134]
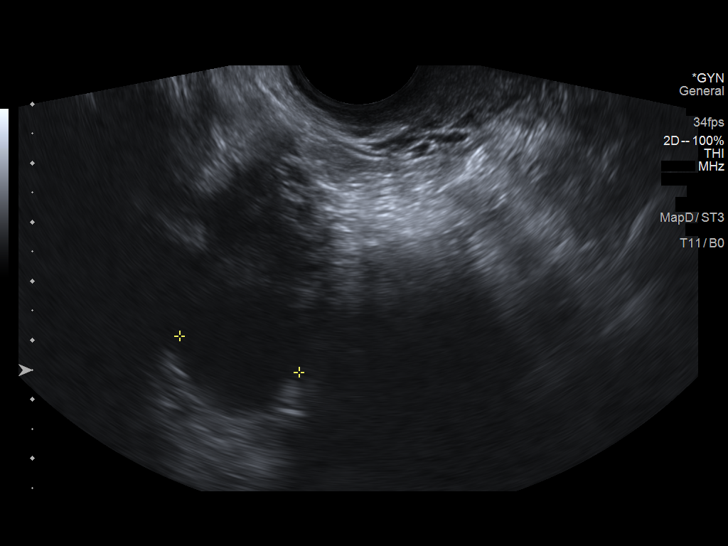
[im 122/134]
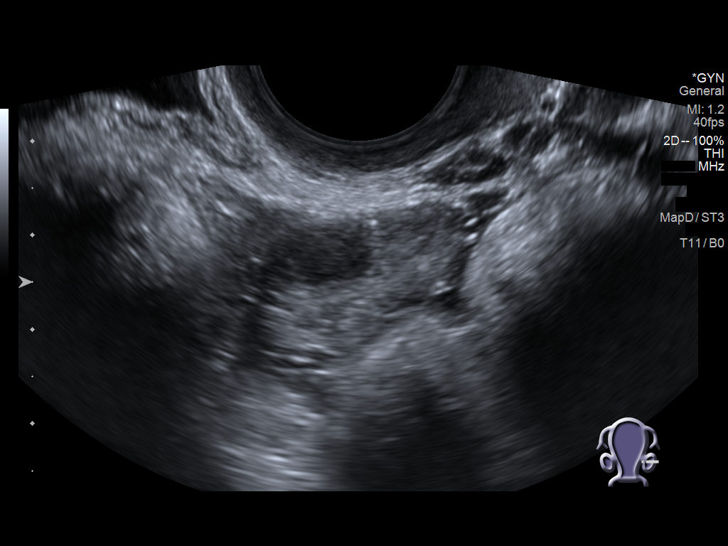
[im 134/134]
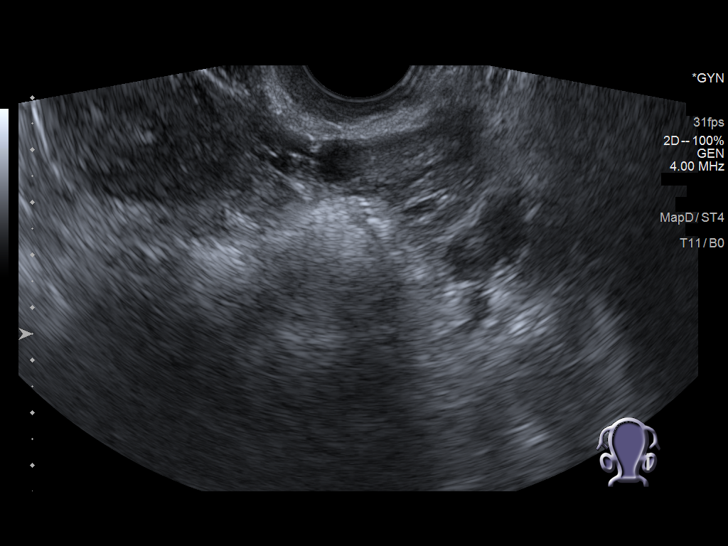

[13 of 25 positions shown; findings below may reference images not displayed]

FINDINGS: Uterus

Measurements: 8.0 x 2.6 x 4.5 cm = volume: 49 mL. Anteverted. Normal
morphology without mass

Endometrium

Thickness: 8 mm. Slightly more prominent/heterogeneous at the upper
where a questionable endometrial/endocervical polyp is identified
1.0 x 1.8 x 3.3 cm.

Right ovary

Measurements: 4.5 x 2.0 x 3.1 cm = volume: 15 mL. Dominant follicle
2.3 cm diameter; no follow-up imaging required. No additional
masses.

Left ovary

Measurements: 3.5 x 1.3 x 2.1 cm = volume: 5 mL. Normal morphology
without mass

Other findings

No free pelvic fluid or adnexal masses.
IMPRESSION: Abnormal heterogeneous thickening of the endometrial
complex/endocervical canal at the lower uterine segment cervix
junction, 1.0 x 1.8 x 3.3 cm in size, cannot exclude
endometrial/endocervical polyp; consider further evaluation with
sonohysterogram for confirmation prior to hysteroscopy. Endometrial
sampling should also be considered if patient is at high risk for
endometrial carcinoma. (Ref: Radiological Reasoning: Algorithmic
Workup of Abnormal Vaginal Bleeding with Endovaginal Sonography and
Sonohysterography. [id number]; [id number])

Remainder of exam unremarkable.

## 2022-03-11 ENCOUNTER — Ambulatory Visit: Payer: 59 | Admitting: Family Medicine

## 2022-03-12 ENCOUNTER — Telehealth: Payer: Self-pay | Admitting: *Deleted

## 2022-03-12 DIAGNOSIS — F5101 Primary insomnia: Secondary | ICD-10-CM

## 2022-03-12 DIAGNOSIS — F341 Dysthymic disorder: Secondary | ICD-10-CM

## 2022-03-12 MED ORDER — QUETIAPINE FUMARATE 50 MG PO TABS: 50.0000 mg | ORAL_TABLET | Freq: Every day | ORAL | 0 refills | Status: DC

## 2022-03-12 MED ORDER — ZOLPIDEM TARTRATE 5 MG PO TABS: 5.0000 mg | ORAL_TABLET | Freq: Every evening | ORAL | 1 refills | Status: DC | PRN

## 2022-03-12 NOTE — Telephone Encounter (Signed)
LVM advising pt of medication changes.

## 2022-03-12 NOTE — Telephone Encounter (Signed)
Pt called wanting to know "how much sleep am I supposed to get taking Lunesta"?  She said that she hasn't been able to sleep any on this medication. She goes to sleep about 10 pm and wakes up around 1 or 2 AM. She said that it is really tired.  I asked if she was taking the Seroquel in addition to the [place] she said that she was.   I said to her that some of this is from her recent life changes that have really affected her sleep so this would contribute to why she may not be able to sleep as well as she has been use to.  She said that she is not working now along with these recent changes.   Pt was advised that I would fwd this to pcp for advice.

## 2022-03-12 NOTE — Telephone Encounter (Signed)
Meds ordered this encounter  Medications   QUEtiapine (SEROQUEL) 50 MG tablet    Sig: Take 1 tablet (50 mg total) by mouth at bedtime.    Dispense:  30 tablet    Refill:  0   zolpidem (AMBIEN) 5 MG tablet    Sig: Take 1 tablet (5 mg total) by mouth at bedtime as needed for sleep.    Dispense:  15 tablet    Refill:  1

## 2022-03-20 ENCOUNTER — Ambulatory Visit: Payer: 59 | Admitting: Family Medicine

## 2022-03-26 ENCOUNTER — Ambulatory Visit: Payer: 59 | Admitting: Family Medicine

## 2022-03-26 ENCOUNTER — Encounter: Payer: Self-pay | Admitting: Family Medicine

## 2022-03-26 ENCOUNTER — Telehealth: Payer: Self-pay

## 2022-03-26 VITALS — BP 110/52 | HR 71 | Ht 66.0 in | Wt 112.1 lb

## 2022-03-26 DIAGNOSIS — F5101 Primary insomnia: Secondary | ICD-10-CM

## 2022-03-26 DIAGNOSIS — Z23 Encounter for immunization: Secondary | ICD-10-CM

## 2022-03-26 DIAGNOSIS — F341 Dysthymic disorder: Secondary | ICD-10-CM

## 2022-03-26 DIAGNOSIS — R69 Illness, unspecified: Secondary | ICD-10-CM | POA: Diagnosis not present

## 2022-03-26 MED ORDER — ZOLPIDEM TARTRATE 5 MG PO TABS: 5.0000 mg | ORAL_TABLET | Freq: Every evening | ORAL | 0 refills | Status: DC | PRN

## 2022-03-26 MED ORDER — FLUOXETINE HCL 20 MG PO CAPS: 20.0000 mg | ORAL_CAPSULE | Freq: Every day | ORAL | 0 refills | Status: DC

## 2022-03-26 NOTE — Progress Notes (Signed)
Established Patient Office Visit  Subjective   Patient ID: Joann Haynes, female    DOB: 1970-12-23  Age: 51 y.o. MRN: [id number]  Chief Complaint  Patient presents with   Follow-up    HPI  F/U Anxiety and Depression -she reports that she is doing better overall she is up to 20 mg on the fluoxetine.  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  He is concerned about it affecting her vision.  F/u Insomnia -currently using 3 mg of Lunesta and we added Seroquel 50 mg she is finally getting maybe about 5 hours of sleep so it has been helpful.      ROS    Objective:     BP (!) 110/52 (BP Location: Left Arm, Patient Position: Sitting, Cuff Size: Small)   Pulse 71   Ht '5\' 6"'$  (1.676 m)   Wt 112 lb 1.3 oz (50.8 kg)   SpO2 100%   BMI 18.09 kg/m    Physical Exam Vitals and nursing note reviewed.  Constitutional:      Appearance: She is well-developed.  HENT:     Head: Normocephalic and atraumatic.  Cardiovascular:     Rate and Rhythm: Normal rate and regular rhythm.     Heart sounds: Normal heart sounds.  Pulmonary:     Effort: Pulmonary effort is normal.     Breath sounds: Normal breath sounds.  Skin:    General: Skin is warm and dry.  Neurological:     Mental Status: She is alert and oriented to person, place, and time.  Psychiatric:        Behavior: Behavior normal.      No results found for any visits on 03/26/22.     The ASCVD Risk score (Arnett DK, et al., 2019) failed to calculate for the following reasons:   Cannot find a previous HDL lab   Cannot find a previous total cholesterol lab    Assessment & Plan:   Problem List Items Addressed This Visit       Other   Insomnia    We discussed maybe doing a trial of Ambien in place of the Lunesta  for a week or 2 to see if she feels like she likes it better if not we can always switch back to Lunesta in the meantime continue with the quetiapine 50 mg.      Relevant Medications   zolpidem (AMBIEN) 5 MG tablet   ANXIETY DEPRESSION - Primary    We will go ahead and switch to the 20 mg capsule fluoxetine since she is currently taking 2 of the tens.  Could consider increasing the dose but right now she actually seems to be doing better overall so think we will go to stay with 20 mg and see her back in about 8 weeks we can make an adjustment at that time if needed.  Reassured her that I do not think that the medication is causing vision change.      Relevant Medications   FLUoxetine (PROZAC) 20 MG capsule   Other Visit Diagnoses     Encounter for immunization       Relevant Orders   Pfizer Fall 2023 Covid-19 Vaccine 65yr and older (Completed)  Return in about 2 months (around 05/27/2022) for sleep  and mood.   I spent 25 minutes on the day of the encounter to include pre-visit record review, face-to-face time with the patient and post visit ordering of test.   Beatrice Lecher, MD

## 2022-03-26 NOTE — Assessment & Plan Note (Addendum)
We will go ahead and switch to the 20 mg capsule fluoxetine since she is currently taking 2 of the tens.  Could consider increasing the dose but right now she actually seems to be doing better overall so think we will go to stay with 20 mg and see her back in about 8 weeks we can make an adjustment at that time if needed.  Reassured her that I do not think that the medication is causing vision change.

## 2022-03-26 NOTE — Telephone Encounter (Signed)
Initiated Prior authorization OKH:[id number] Tartrate '5MG'$  tablets Via: Covermymeds Case/Key:[id number] Status: approved  as of 03/26/22 Reason:this request is approved from 03/26/2022 to 03/27/2023 Notified Pt via: Mychart

## 2022-03-26 NOTE — Assessment & Plan Note (Signed)
We discussed maybe doing a trial of Ambien in place of the [facility] for a week or 2 to see if she feels like she likes it better if not we can always switch back to [facility] in the meantime continue with the quetiapine 50 mg.

## 2022-03-28 ENCOUNTER — Telehealth: Payer: Self-pay | Admitting: *Deleted

## 2022-03-28 NOTE — Telephone Encounter (Signed)
Pt called and stated that she had an panic attack while at the hospital with her husband who is having surgery.  She stated that she was reading a book and her vision began to bother her so she got up and began to walk around and pinched her self she called her mother in law she told her to find a nurse but they instructed her to call her pcp to get help. She stated that she feels that she believes that this came from taking the Ambien. I told her that I couldn't be sure.     Pt stated that she is now feeling like she is going to pass out. I advised her that she should sit down if she wasn't already doing so she stated that she was. I told her that she's going to be ok. Advised her to do some deep breaths. I stayed on the phone with her as she did these  She has taken the Fluoxetine today. She said she feels like she just wants to cry.    I told her that she's going to be ok. Advised her to do some deep breaths. I stayed on the phone with her as she did these    I then told her to begin taking deep breaths in thru her nose and out thru her mouth and to control the breaths that she takes as she does this. I stayed on the phone as she did this and after about one minute I asked her how she felt. She stated that she feels that this is helping some. I also asked if she has eaten anything today she stated that she has.

## 2022-04-08 ENCOUNTER — Telehealth: Payer: Self-pay | Admitting: Family Medicine

## 2022-04-08 ENCOUNTER — Other Ambulatory Visit: Payer: Self-pay | Admitting: Family Medicine

## 2022-04-08 DIAGNOSIS — F5101 Primary insomnia: Secondary | ICD-10-CM

## 2022-04-08 DIAGNOSIS — F341 Dysthymic disorder: Secondary | ICD-10-CM

## 2022-04-08 NOTE — Telephone Encounter (Signed)
Patient dropped off Health examination paper work to be completed put in [place].

## 2022-04-09 NOTE — Telephone Encounter (Signed)
I have not seen these in my basket.

## 2022-04-10 ENCOUNTER — Ambulatory Visit (INDEPENDENT_AMBULATORY_CARE_PROVIDER_SITE_OTHER): Payer: 59 | Admitting: Professional

## 2022-04-10 ENCOUNTER — Encounter: Payer: Self-pay | Admitting: Professional

## 2022-04-10 DIAGNOSIS — R69 Illness, unspecified: Secondary | ICD-10-CM | POA: Diagnosis not present

## 2022-04-10 DIAGNOSIS — F331 Major depressive disorder, recurrent, moderate: Secondary | ICD-10-CM | POA: Diagnosis not present

## 2022-04-10 DIAGNOSIS — F411 Generalized anxiety disorder: Secondary | ICD-10-CM | POA: Diagnosis not present
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
## 2022-04-10 NOTE — Progress Notes (Signed)
° ° ° ° ° ° ° ° ° ° ° ° ° ° °  [place], LCMHC °

## 2022-04-25 ENCOUNTER — Ambulatory Visit: Payer: 59 | Admitting: Professional

## 2022-05-08 ENCOUNTER — Ambulatory Visit: Payer: 59 | Admitting: Professional

## 2022-05-20 ENCOUNTER — Encounter: Payer: 59 | Admitting: Family Medicine

## 2022-05-22 ENCOUNTER — Other Ambulatory Visit: Payer: Self-pay | Admitting: Family Medicine

## 2022-05-22 DIAGNOSIS — F5101 Primary insomnia: Secondary | ICD-10-CM

## 2022-05-27 ENCOUNTER — Encounter: Payer: Self-pay | Admitting: Family Medicine

## 2022-05-27 ENCOUNTER — Ambulatory Visit: Payer: 59 | Admitting: Family Medicine

## 2022-05-27 VITALS — BP 98/48 | HR 67 | Ht 66.0 in | Wt 110.0 lb

## 2022-05-27 DIAGNOSIS — F5101 Primary insomnia: Secondary | ICD-10-CM | POA: Diagnosis not present

## 2022-05-27 DIAGNOSIS — F341 Dysthymic disorder: Secondary | ICD-10-CM

## 2022-05-27 DIAGNOSIS — Z23 Encounter for immunization: Secondary | ICD-10-CM | POA: Diagnosis not present

## 2022-05-27 DIAGNOSIS — R69 Illness, unspecified: Secondary | ICD-10-CM | POA: Diagnosis not present

## 2022-05-27 NOTE — Progress Notes (Signed)
   Established Patient Office Visit  Subjective   Patient ID: Joann Haynes, female    DOB: 17-Dec-1970  Age: 52 y.o. MRN: [id number]  Chief Complaint  Patient presents with   Insomnia   mood    HPI  Follow-up anxiety/depression-we decided to switch her to the 20 g fluoxetine So.  She was taking 2 of the 10 send last saw her.  She is been on this for almost 2 months.  Go to 1 therapy session but did not follow back up because she received a bill.  She has still not found a new job but is looking but does feel like mentally she is in a better place.  Follow up insomnia-started Ambien as needed back in December.  She had previously tried [place] she is also already on quetiapine. .She feels like this medication works better.    She also received a bill recently that she brought in that she had some questions regarding it.  I did encourage her to check with her insurance.  It looks like she just had a co-pay.    ROS    Objective:     BP (!) 98/48   Pulse 67   Ht '5\' 6"'$  (1.676 m)   Wt 110 lb (49.9 kg)   LMP 05/24/2022 (Exact Date)   SpO2 100%   BMI 17.75 kg/m    Physical Exam Vitals and nursing note reviewed.  Constitutional:      Appearance: She is well-developed.  HENT:     Head: Normocephalic and atraumatic.  Cardiovascular:     Rate and Rhythm: Normal rate and regular rhythm.     Heart sounds: Normal heart sounds.  Pulmonary:     Effort: Pulmonary effort is normal.     Breath sounds: Normal breath sounds.  Skin:    General: Skin is warm and dry.  Neurological:     Mental Status: She is alert and oriented to person, place, and time.  Psychiatric:        Behavior: Behavior normal.      No results found for any visits on 05/27/22.    The ASCVD Risk score (Arnett DK, et al., 2019) failed to calculate for the following reasons:   Cannot find a previous HDL lab   Cannot find a previous total cholesterol lab    Assessment & Plan:   Problem List Items  Addressed This Visit       Other   Insomnia - Primary    Much better response to Ambien versus Lunesta which is great.  Continue current regimen we actually had just sent refills over earlier this month.  Follow-up in 3 months.      ANXIETY DEPRESSION    Feels  like she is doing better emotionally overall.  Continue current regimen.  She is still looking for a new job.  She feels like getting some better sleep has really helped.      Other Visit Diagnoses     Need for Zostavax administration       Relevant Orders   Zoster Recombinant (Shingrix ) (Completed)       Return in about 3 months (around 08/25/2022) for Mood and sleep .    Beatrice Lecher, MD

## 2022-05-27 NOTE — Assessment & Plan Note (Signed)
Feels  like she is doing better emotionally overall.  Continue current regimen.  She is still looking for a new job.  She feels like getting some better sleep has really helped.

## 2022-05-27 NOTE — Assessment & Plan Note (Signed)
Much better response to Ambien versus Lunesta which is great.  Continue current regimen we actually had just sent refills over earlier this month.  Follow-up in 3 months.

## 2022-06-13 ENCOUNTER — Encounter: Payer: Self-pay | Admitting: Family Medicine

## 2022-06-15 ENCOUNTER — Encounter: Payer: Self-pay | Admitting: Family Medicine

## 2022-06-16 ENCOUNTER — Other Ambulatory Visit: Payer: Self-pay | Admitting: Family Medicine

## 2022-06-20 ENCOUNTER — Encounter: Payer: Self-pay | Admitting: Family Medicine

## 2022-06-20 ENCOUNTER — Ambulatory Visit
Admission: EM | Admit: 2022-06-20 | Discharge: 2022-06-20 | Disposition: A | Discharge status: Home / Self Care | Payer: 59 | Attending: Family Medicine | Admitting: Family Medicine

## 2022-06-20 DIAGNOSIS — S0990XA Unspecified injury of head, initial encounter: Secondary | ICD-10-CM | POA: Diagnosis not present

## 2022-06-20 NOTE — ED Provider Notes (Signed)
[facility]    CSN: [id number] Arrival date & time: 06/20/22  1240      History   Chief Complaint Chief Complaint  Patient presents with   Fall   Head Injury    HPI Joann Haynes is a 52 y.o. female.   While walking into kitchen last night patient felt dizzy after taking her Seroquel.  She fell, hitting her chin and then her occipital area.  She denies loss of consciousness.  She subsequently had a headache and nausea/vomiting after eating.  Her headache and nausea have resolved.  She has mild neck stiffness and soreness over occipital area.  She admits that she has felt mildly "fuzzy-headed" but denies other neurologic symptoms.  The history is provided by the patient.    Past Medical History:  Diagnosis Date   Bed wetting 09/12/2010   Depression    Uterine polyp     Patient Active Problem List   Diagnosis Date Noted   Menopause 02/04/2022   Anxiety 09/04/2021   Abnormal mammogram 12/08/2020   Urinary frequency 04/15/2017   Cervical lesion 04/15/2017   Microscopic hematuria 04/15/2017   Abnormal uterine bleeding (AUB) 04/15/2017   Insomnia 06/27/2010   ANXIETY DEPRESSION 03/25/2010    Past Surgical History:  Procedure Laterality Date   COLONOSCOPY      OB History     Gravida  0   Para  0   Term  0   Preterm  0   AB  0   Living  0      SAB  0   IAB  0   Ectopic  0   Multiple  0   Live Births  0            Home Medications    Prior to Admission medications   Medication Sig Start Date End Date Taking? Authorizing Provider  acetaminophen (TYLENOL) 325 MG tablet Take 650 mg by mouth every 6 (six) hours as needed.   Yes [provider]  zolpidem (AMBIEN) 5 MG tablet TAKE 1 TABLET BY MOUTH AT BEDTIME AS NEEDED FOR SLEEP 05/22/22  Yes Hali Marry, MD  FLUoxetine (PROZAC) 20 MG capsule Take 1 capsule by mouth once daily 06/16/22   Hali Marry, MD  QUEtiapine (SEROQUEL) 50 MG tablet TAKE 1 TABLET BY  MOUTH AT BEDTIME 04/08/22   Hali Marry, MD    Family History Family History  Problem Relation Age of Onset   Hypertension Mother    Breast cancer Mother 64   Colon cancer Maternal Uncle 50   Colon polyps Maternal Uncle 10   Diabetes Maternal Grandfather    Esophageal cancer Neg Hx    Rectal cancer Neg Hx    Stomach cancer Neg Hx     Social History Social History   Tobacco Use   Smoking status: Never    Passive exposure: Never   Smokeless tobacco: Never  Vaping Use   Vaping Use: Never used  Substance Use Topics   Alcohol use: Not Currently   Drug use: No     Allergies   Belsomra [suvorexant]   Review of Systems Review of Systems  Constitutional:  Positive for fatigue. Negative for activity change, appetite change, diaphoresis and fever.  HENT:  Negative for ear pain, facial swelling, mouth sores, nosebleeds and tinnitus.        Small contusion on chin.  Eyes:  Negative for photophobia, pain and visual disturbance.  Respiratory:  Negative for chest tightness  and shortness of breath.   Cardiovascular: Negative.   Gastrointestinal: Negative.   Genitourinary: Negative.   Musculoskeletal:  Positive for neck stiffness. Negative for arthralgias, back pain and neck pain.  Skin:  Positive for color change.  Neurological:  Negative for dizziness, syncope, facial asymmetry, speech difficulty, weakness, light-headedness, numbness and headaches.     Physical Exam Triage Vital Signs ED Triage Vitals  Enc Vitals Group     BP 06/20/22 1258 109/71     Pulse Rate 06/20/22 1258 75     Resp 06/20/22 1258 16     Temp 06/20/22 1258 97.9 F (36.6 C)     Temp Source 06/20/22 1258 Oral     SpO2 06/20/22 1258 100 %     Weight 06/20/22 1254 118 lb (53.5 kg)     Height 06/20/22 1254 5' 6.5" (1.689 m)     Head Circumference --      Peak Flow --      Pain Score 06/20/22 1254 0     Pain Loc --      Pain Edu? --      Excl. in [place]? --    No data found.  Updated Vital  Signs BP 109/71 (BP Location: Right Arm)   Pulse 75   Temp 97.9 F (36.6 C) (Oral)   Resp 16   Ht 5' 6.5" (1.689 m)   Wt 53.5 kg   LMP 05/24/2022 (Exact Date)   SpO2 100%   BMI 18.76 kg/m   Visual Acuity Right Eye Distance:   Left Eye Distance:   Bilateral Distance:    Right Eye Near:   Left Eye Near:    Bilateral Near:     Physical Exam Nursing notes and Vital Signs reviewed. Appearance:  Patient is oriented, appears stated age, and in no acute distress Eyes:  Pupils are equal, round, and reactive to light and accomodation.  Extraocular movement is intact.  Conjunctivae are not inflamed.  Fundi benign.  Ears:  Canals normal.  Tympanic membranes normal.  Nose: Normal turbinates.  No sinus tenderness.  Mouth:  Normal Pharynx:  Normal Head:  Small ecchymosis on chin 1cm by 2cm, tender to palpation without swelling.  Left occipital area has area of mild tenderness to palpation without hematoma or evidence depressed skull fracture. Neck:  Supple, full range of motion without tenderness to palpation.  No adenopathy. Lungs:  Clear to auscultation.  Breath sounds are equal.  Moving air well. Heart:  Regular rate and rhythm without murmurs, rubs, or gallops.  Abdomen:  Nontender without masses or hepatosplenomegaly.  Bowel sounds are present.  No CVA or flank tenderness.  Extremities:  No edema.  Skin:  No rash present.  Neurologic:  Cranial nerves 2 through 12 are normal.  Patellar, achilles, and elbow reflexes are normal.  Cerebellar function is intact (finger-to-nose and rapid alternating hand movement).  Distal neurovascular function is intact.    UC Treatments / Results  Labs (all labs ordered are listed, but only abnormal results are displayed) Labs Reviewed - No data to display  EKG   Radiology No results found.  Procedures Procedures (including critical care time)  Medications Ordered in UC Medications - No data to display  Initial Impression / Assessment and  Plan / UC Course  I have reviewed the triage vital signs and the nursing notes.  Pertinent labs & imaging results that were available during my care of the patient were reviewed by me and considered in my medical decision making (  see chart for details).    Benign exam. Minor head injury, but patient may have concussion.  Concussion precautions discussed. Treat symptomatically for now. Followup with Family Doctor if not improved in one week.   Final Clinical Impressions(s) / UC Diagnoses   Final diagnoses:  Minor head injury, initial encounter     Discharge Instructions      Rest.  May take Tylenol if needed for pain.  Apply ice pack for to painful areas for 15 minutes, 3 to 4 times daily  Continue until pain and swelling decrease.   If symptoms become significantly worse during the night or over the weekend, proceed to the local emergency room.     ED Prescriptions   None       Kandra Nicolas, MD 06/22/22 1644

## 2022-06-20 NOTE — ED Triage Notes (Addendum)
Pt presents to Urgent Care with c/o falling and hitting her head last night. Reports she does not currently have a HA or nausea, but she had a headache and n/v last night after the incident occurred. No LOC. Denies blurred vision, grips equal and strong, smile symmetrical. Pt also reports neck stiffness.

## 2022-06-20 NOTE — Discharge Instructions (Signed)
Rest.  May take Tylenol if needed for pain.  Apply ice pack for to painful areas for 15 minutes, 3 to 4 times daily  Continue until pain and swelling decrease.   If symptoms become significantly worse during the night or over the weekend, proceed to the local emergency room.

## 2022-06-21 ENCOUNTER — Telehealth: Payer: Self-pay | Admitting: Emergency Medicine

## 2022-06-21 NOTE — Telephone Encounter (Signed)
[place].  Advised if doing well to disregard the call.  Any questions or concerns, feel free to contact the office.

## 2022-06-27 ENCOUNTER — Other Ambulatory Visit: Payer: Self-pay | Admitting: Family Medicine

## 2022-06-27 DIAGNOSIS — Z0000 Encounter for general adult medical examination without abnormal findings: Secondary | ICD-10-CM

## 2022-07-07 ENCOUNTER — Encounter: Payer: Self-pay | Admitting: Family Medicine

## 2022-07-14 ENCOUNTER — Other Ambulatory Visit: Payer: Self-pay | Admitting: Family Medicine

## 2022-07-14 DIAGNOSIS — F341 Dysthymic disorder: Secondary | ICD-10-CM

## 2022-07-14 DIAGNOSIS — F5101 Primary insomnia: Secondary | ICD-10-CM

## 2022-07-29 ENCOUNTER — Encounter: Payer: Self-pay | Admitting: Family Medicine

## 2022-08-25 ENCOUNTER — Encounter: Payer: Self-pay | Admitting: Family Medicine

## 2022-08-25 ENCOUNTER — Ambulatory Visit: Payer: 59

## 2022-08-25 ENCOUNTER — Ambulatory Visit: Payer: 59 | Admitting: Family Medicine

## 2022-08-25 VITALS — BP 107/61 | HR 74 | Ht 66.0 in | Wt 119.0 lb

## 2022-08-25 DIAGNOSIS — F341 Dysthymic disorder: Secondary | ICD-10-CM | POA: Diagnosis not present

## 2022-08-25 DIAGNOSIS — L989 Disorder of the skin and subcutaneous tissue, unspecified: Secondary | ICD-10-CM | POA: Diagnosis not present

## 2022-08-25 DIAGNOSIS — F5101 Primary insomnia: Secondary | ICD-10-CM

## 2022-08-25 NOTE — Assessment & Plan Note (Signed)
Doing OK on Prozac, Discussed setting a plan and agenda for the week. Encouraged her to reach out to schedule with the therapist.

## 2022-08-25 NOTE — Progress Notes (Signed)
    Established Patient Office Visit  Subjective   Patient ID: Joann Haynes, female    DOB: 1970/07/23  Age: 52 y.o. MRN: [id number]  Chief Complaint  Patient presents with   mood   Insomnia    HPI  Follow-up mood/insomnia-she actually feels like she has been resting well lately she has not used the Ambien in a couple of months and has not used the Seroquel in about 2 weeks.  Regard to her mood she has been doing okay she says some days she just has difficulty prioritizing things and then when she does not get the things done that she really wants to she gets kind down on herself.  She has been exercising regularly which has been great in fact she really enjoys it.  He is also noticed a little brown spot on the left side of her face just lateral to her nose that she says has been itchy.  It is fairly new.    ROS    Objective:     BP 107/61   Pulse 74   Ht 5\' 6"  (1.676 m)   Wt 119 lb (54 kg)   SpO2 100%   BMI 19.21 kg/m    Physical Exam Vitals and nursing note reviewed.  Constitutional:      Appearance: She is well-developed.  HENT:     Head: Normocephalic and atraumatic.  Pulmonary:     Effort: Pulmonary effort is normal.  Skin:    General: Skin is warm and dry.  Neurological:     Mental Status: She is alert and oriented to person, place, and time.  Psychiatric:        Behavior: Behavior normal.      No results found for any visits on 08/25/22.    The ASCVD Risk score (Arnett DK, et al., 2019) failed to calculate for the following reasons:   Cannot find a previous HDL lab   Cannot find a previous total cholesterol lab    Assessment & Plan:   Problem List Items Addressed This Visit       Other   Insomnia    Off of Ambien and Seroquel. Doing much better.        ANXIETY DEPRESSION    Doing OK on Prozac, Discussed setting a plan and agenda for the week. Encouraged her to reach out to schedule with the therapist.        Other Visit Diagnoses      Skin lesion    -  Primary   Relevant Orders   Ambulatory referral to Dermatology      Skin lesion look like a solar lentigo but she says it is itchy so will refer to dermatology.    Return in about 4 months (around 12/26/2022) for Mood.    Nani Gasser, MD

## 2022-08-25 NOTE — Assessment & Plan Note (Signed)
Off of Ambien and Seroquel. Doing much better.

## 2022-09-05 ENCOUNTER — Ambulatory Visit: Payer: 59

## 2022-09-10 ENCOUNTER — Other Ambulatory Visit: Payer: Self-pay | Admitting: Family Medicine

## 2022-09-23 DIAGNOSIS — L814 Other melanin hyperpigmentation: Secondary | ICD-10-CM | POA: Diagnosis not present

## 2022-09-23 DIAGNOSIS — D2239 Melanocytic nevi of other parts of face: Secondary | ICD-10-CM | POA: Diagnosis not present

## 2022-09-23 DIAGNOSIS — L821 Other seborrheic keratosis: Secondary | ICD-10-CM | POA: Diagnosis not present

## 2022-09-23 DIAGNOSIS — D225 Melanocytic nevi of trunk: Secondary | ICD-10-CM | POA: Diagnosis not present

## 2022-10-16 ENCOUNTER — Telehealth: Payer: Self-pay | Admitting: Family Medicine

## 2022-10-16 NOTE — Telephone Encounter (Signed)
Patient is requesting refills on Fluoxetine 20mg   [facility] [address] phone is [phone]

## 2022-10-16 NOTE — Telephone Encounter (Signed)
Pt has refills. This was sent to her pharmacy on 09/10/22 #90. She should call her pharmacy

## 2022-10-24 ENCOUNTER — Ambulatory Visit
Admission: RE | Admit: 2022-10-24 | Discharge: 2022-10-24 | Disposition: A | Discharge status: Home / Self Care | Payer: 59 | Source: Ambulatory Visit | Attending: Family Medicine | Admitting: Family Medicine

## 2022-10-24 DIAGNOSIS — Z1231 Encounter for screening mammogram for malignant neoplasm of breast: Secondary | ICD-10-CM | POA: Diagnosis not present

## 2022-10-24 DIAGNOSIS — Z0000 Encounter for general adult medical examination without abnormal findings: Secondary | ICD-10-CM

## 2022-10-27 NOTE — Progress Notes (Signed)
Please call patient. Normal mammogram.  Repeat in 1 year.  

## 2022-10-28 ENCOUNTER — Other Ambulatory Visit: Payer: Self-pay | Admitting: Family Medicine

## 2022-10-29 MED ORDER — FLUOXETINE HCL 20 MG PO CAPS: 20.0000 mg | ORAL_CAPSULE | Freq: Every day | ORAL | 0 refills | Status: DC

## 2022-11-13 ENCOUNTER — Telehealth: Payer: Self-pay | Admitting: *Deleted

## 2022-11-13 NOTE — Telephone Encounter (Signed)
Pt called stating that she had accidentally taken an extra dose of her fluoxetine today and was feeling weird and tired. I told her that she was ok and to stay hydrated and to take her normal dose tomorrow.

## 2022-12-29 ENCOUNTER — Encounter: Payer: Self-pay | Admitting: Family Medicine

## 2022-12-29 ENCOUNTER — Ambulatory Visit: Payer: 59 | Admitting: Family Medicine

## 2022-12-29 VITALS — BP 106/49 | HR 66 | Ht 66.0 in | Wt 118.0 lb

## 2022-12-29 DIAGNOSIS — F5101 Primary insomnia: Secondary | ICD-10-CM | POA: Diagnosis not present

## 2022-12-29 DIAGNOSIS — F341 Dysthymic disorder: Secondary | ICD-10-CM | POA: Diagnosis not present

## 2022-12-29 DIAGNOSIS — Z23 Encounter for immunization: Secondary | ICD-10-CM | POA: Diagnosis not present

## 2022-12-29 NOTE — Progress Notes (Signed)
   Established Patient Office Visit  Subjective   Patient ID: Joann Haynes, female    DOB: [birth year]  Age: 52 y.o. MRN: [id number]  Chief Complaint  Patient presents with   mood    HPI  Follow - Up anxiety/depression-only on fluoxetine 20 mg.  She feels like it has been helpful for her and wants to continue with it for now.  She feels like most days are good.  She does report several days of little interest or pleasure in doing things and difficulty with some sleep sometimes but overall she feels like she is not having to rely on medication to help with her sleep.  She denies any other depressive symptoms and denies any anxiety symptoms and rates them overall is not difficult.  Is also been exercising more consistently doing some cardio and some strength training.  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  She feels like it has been less stressful which is great.    ROS    Objective:     BP (!) 106/49   Pulse 66   Ht 5\' 6"  (1.676 m)   Wt 118 lb (53.5 kg)   SpO2 99%   BMI 19.05 kg/m    Physical Exam Vitals and nursing note reviewed.  Constitutional:      Appearance: Normal appearance.  HENT:     Head: Normocephalic and atraumatic.  Eyes:     Conjunctiva/sclera: Conjunctivae normal.  Cardiovascular:     Rate and Rhythm: Normal rate and regular rhythm.  Pulmonary:     Effort: Pulmonary effort is normal.     Breath sounds: Normal breath sounds.  Skin:    General: Skin is warm and dry.  Neurological:     Mental Status: She is alert.  Psychiatric:        Mood and Affect: Mood normal.      No results found for any visits on 12/29/22.    The ASCVD Risk score (Arnett DK, et al., 2019) failed to calculate for the following reasons:   Cannot find a previous HDL lab   Cannot find a previous total cholesterol lab    Assessment & Plan:   Problem List Items Addressed This Visit       Other   Insomnia     Currently doing well on current regimen.        ANXIETY DEPRESSION - Primary    Happy with the fluoxetine 20 mg thus far no problems or concerns she would like continue with the medication for now.  Follow back up in 6 months.  She is at a job that she is enjoying and is much less stressful which is great.      Other Visit Diagnoses     Encounter for immunization       Relevant Orders   Flu vaccine trivalent PF, 6mos and older(Flulaval,Afluria,Fluarix,Fluzone) (Completed)       Return in about 6 months (around 06/28/2023) for Mood , Mood.    Nani Gasser, MD

## 2022-12-29 NOTE — Assessment & Plan Note (Signed)
Currently doing well on current regimen.

## 2022-12-29 NOTE — Assessment & Plan Note (Signed)
Happy with the fluoxetine 20 mg thus far no problems or concerns she would like continue with the medication for now.  Follow back up in 6 months.  She is at a job that she is enjoying and is much less stressful which is great.

## 2023-04-09 ENCOUNTER — Other Ambulatory Visit: Payer: Self-pay | Admitting: Family Medicine

## 2023-04-09 MED ORDER — FLUOXETINE HCL 20 MG PO CAPS: 20.0000 mg | ORAL_CAPSULE | Freq: Every day | ORAL | 0 refills | Status: DC

## 2023-06-29 ENCOUNTER — Ambulatory Visit: Payer: 59 | Admitting: Family Medicine

## 2023-07-08 ENCOUNTER — Other Ambulatory Visit: Payer: Self-pay | Admitting: Family Medicine

## 2023-07-08 NOTE — Telephone Encounter (Signed)
Called patient, LVM to call office to schedule an appointment, thanks.

## 2023-07-08 NOTE — Telephone Encounter (Signed)
 Please call pt and have her schedule a f/u appointment for refills on Fluoxetine. Thanks.

## 2023-09-02 IMAGING — DX DG ABDOMEN 1V
1 series · 1 of 1 positions shown · non-contrast
Comparison: None.

CLINICAL DATA: Right flank pain

EXAM:
ABDOMEN - 1 VIEW

[abdomen kub]
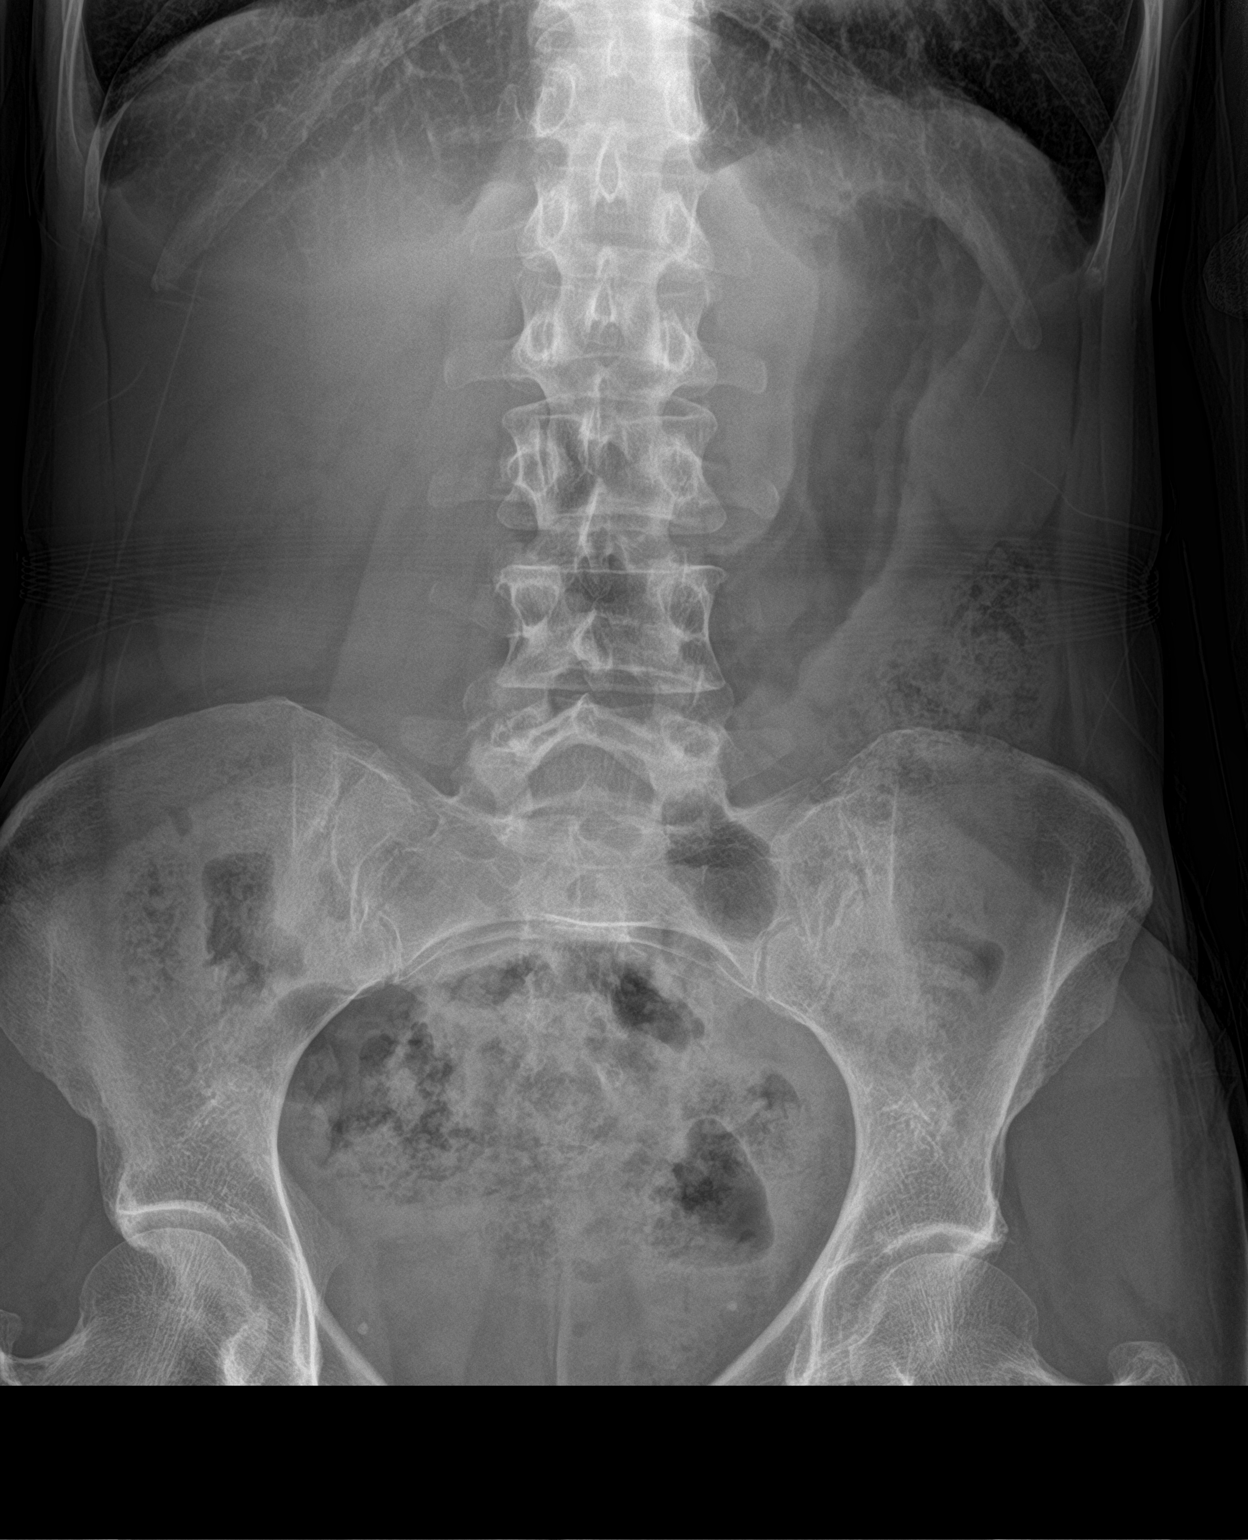

[1 of 1 positions shown; findings below may reference images not displayed]

FINDINGS: Bowel gas pattern is nonspecific. Moderate amount of stool is seen
in colon. There is no fecal impaction in the rectum. No abnormal
masses or calcifications are seen. Ureteral calculus could easily be
obscured by bowel contents. There are small round calcifications in
both sides of pelvis most likely vascular.
IMPRESSION: Nonspecific bowel gas pattern. No abnormal masses or calcifications
are seen.

## 2023-09-15 ENCOUNTER — Encounter: Payer: Self-pay | Admitting: Internal Medicine

## 2023-10-05 ENCOUNTER — Other Ambulatory Visit: Payer: Self-pay | Admitting: Family Medicine

## 2023-10-06 NOTE — Telephone Encounter (Signed)
 Copied from CRM [id number]. Topic: Clinical - Medical Advice >> Oct 06, 2023 11:57 AM Julie Oddi wrote: Reason for CRM: Patient called in regarding medication refill and follow up appointment. Patient has other insurance and can no longer see Dr. Greer Leak. Patient is ok with refill that was filled but will now have new provider fill medications going forward.

## 2023-10-06 NOTE — Telephone Encounter (Signed)
 Attempted to call pt but unable to reach. Left a detailed message for pt. Have removed Dr. Greer Leak as PCP since pt will no longer be able to see Dr. Greer Leak. Told pt to request records from office when she does see new PCP.

## 2023-10-06 NOTE — Telephone Encounter (Signed)
 Medication filled but she does need to schedule follow-up in August.

## 2023-10-06 NOTE — Telephone Encounter (Signed)
 Please advise on refill request

## 2023-10-06 NOTE — Telephone Encounter (Signed)
 Attempted to call pt to schedule a f/u for her in August but unable to reach. Left detailed message for her to call office to get appt scheduled.

## 2023-11-15 IMAGING — US US BREAST*L* LIMITED INC AXILLA
1 series · 12 of 12 positions shown · non-contrast
Comparison: Previous exam(s).

CLINICAL DATA: Follow-up probably benign complicated cysts in the 2
o'clock and 12 o'clock positions of the left breast.

EXAM:
DIGITAL DIAGNOSTIC BILATERAL MAMMOGRAM WITH TOMOSYNTHESIS AND CAD;
ULTRASOUND LEFT BREAST LIMITED
TECHNIQUE: Bilateral digital diagnostic mammography and breast tomosynthesis
was performed. The images were evaluated with computer-aided
detection.; Targeted ultrasound examination of the left breast was
performed.

[Series 1: us breast*left* limited inc axilla · 0.05mm/px · 12 of 12 slices shown]
[im 1/12]
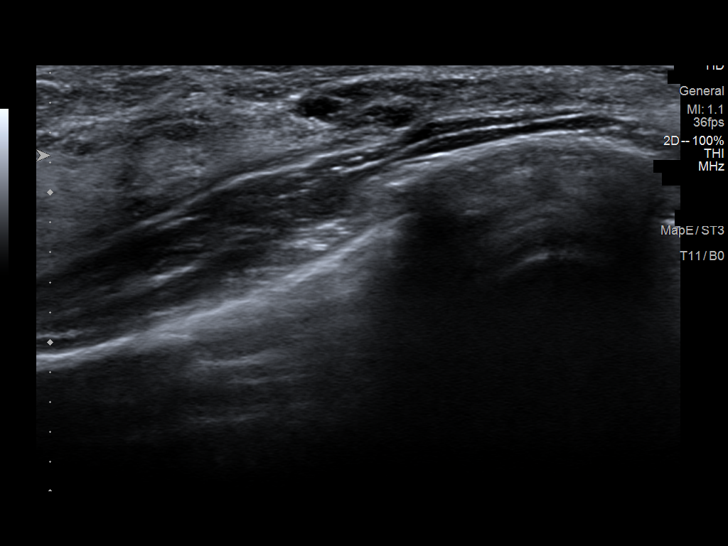
[im 2/12]
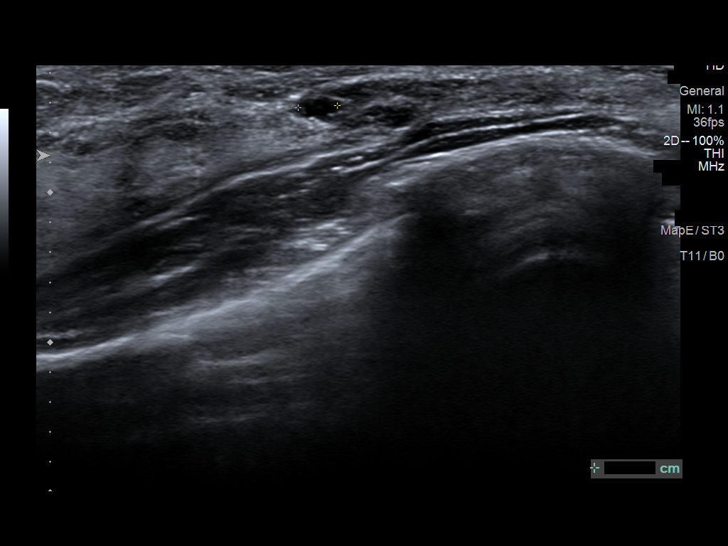
[im 3/12]
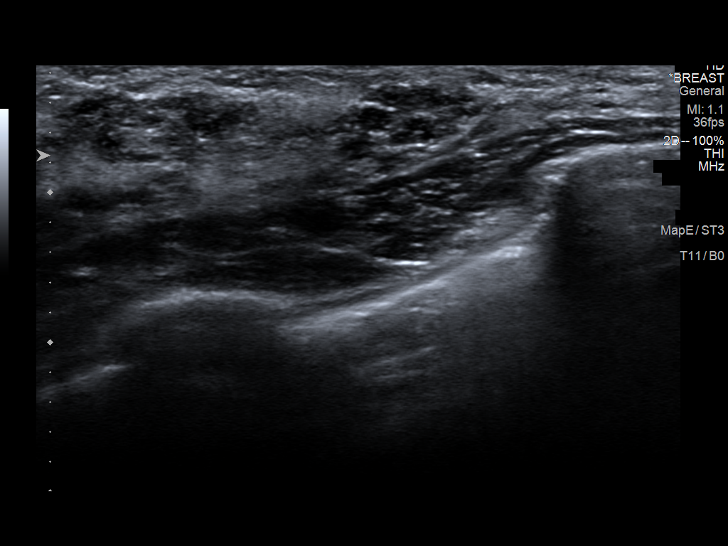
[im 4/12]
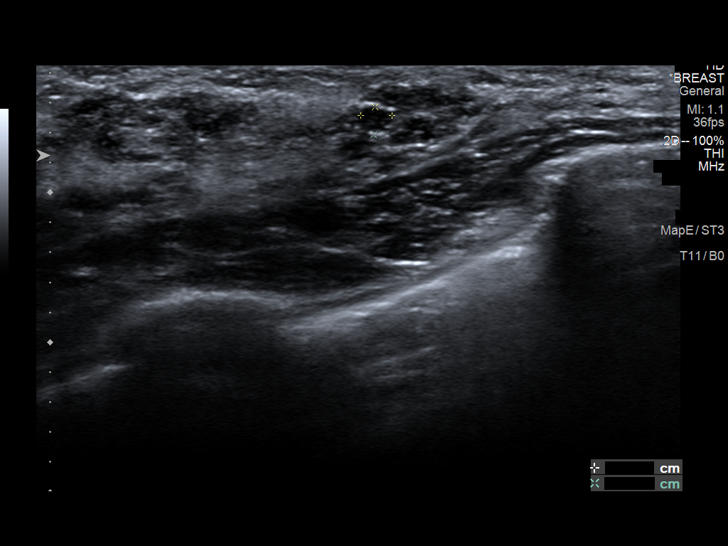
[im 5/12]
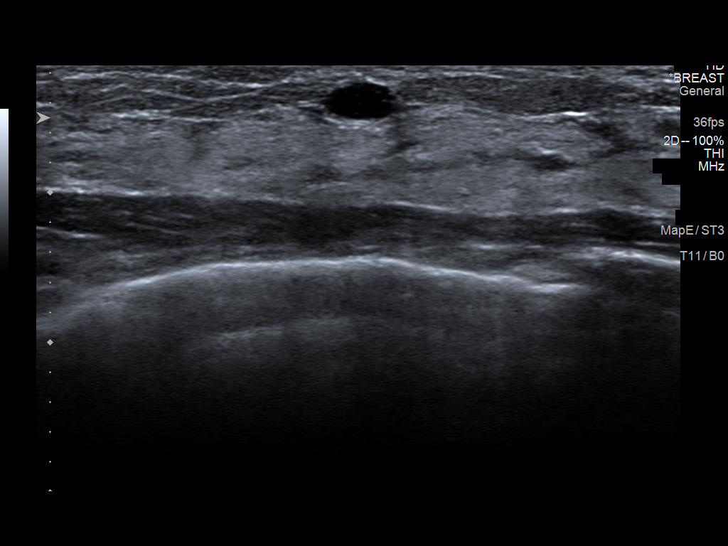
[im 6/12]
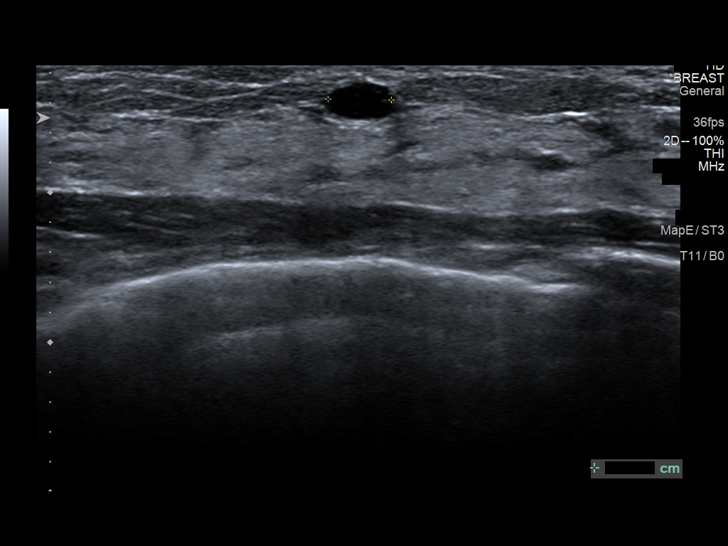
[im 7/12]
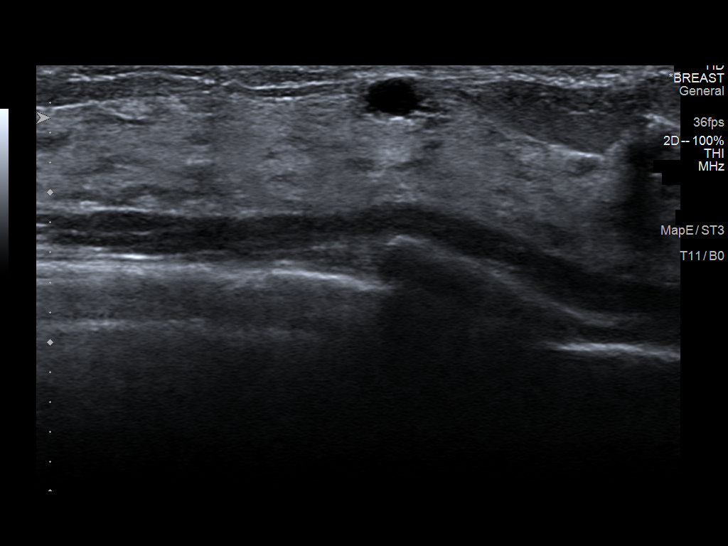
[im 8/12]
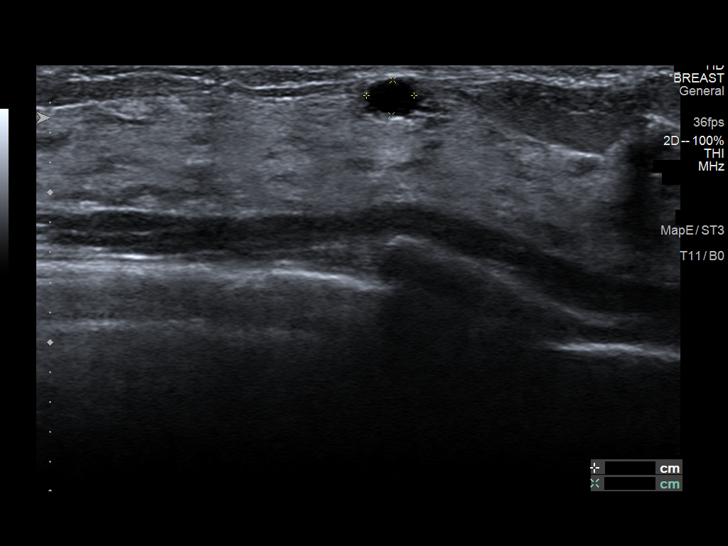
[im 9/12]
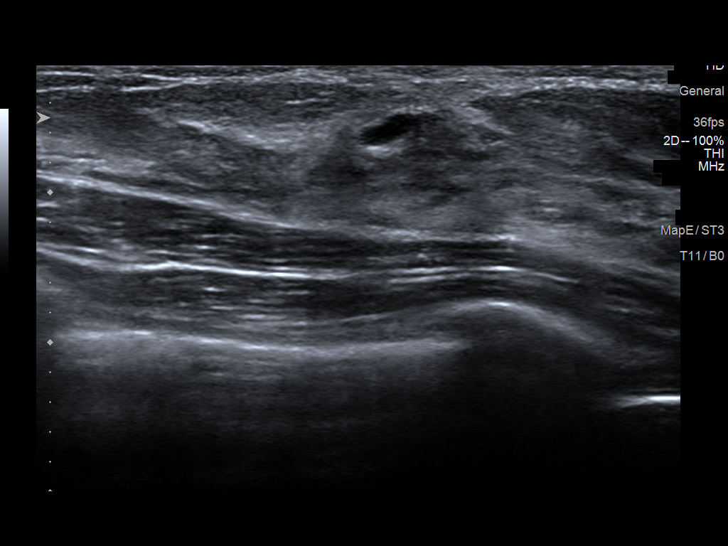
[im 10/12]
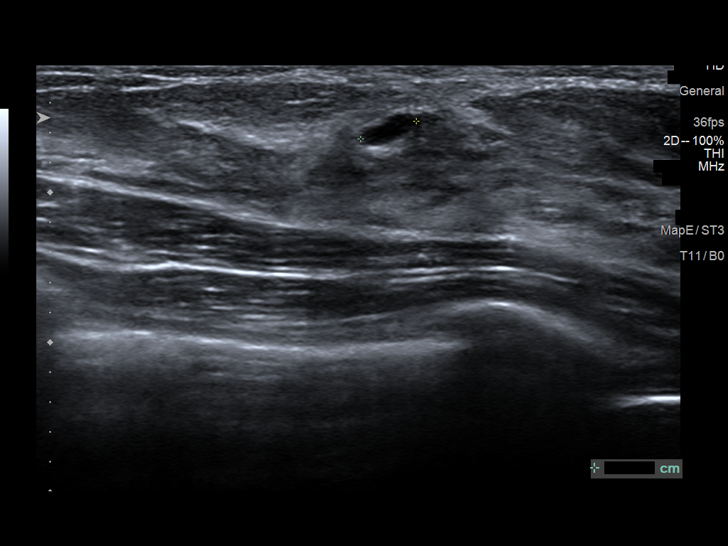
[im 11/12]
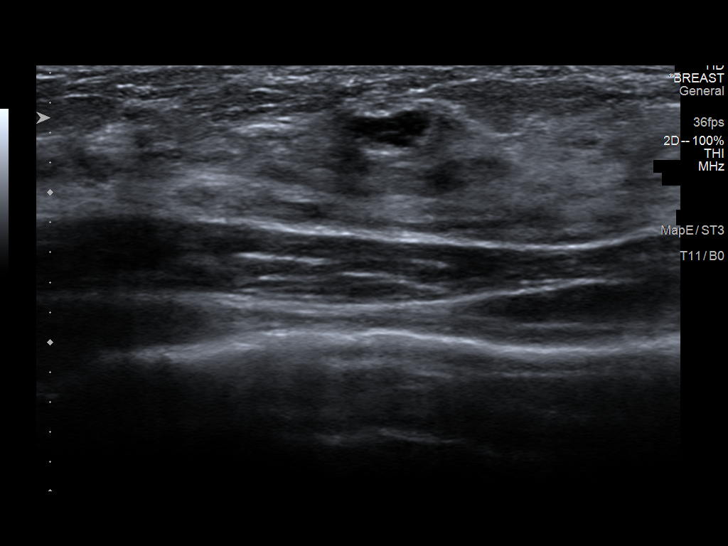
[im 12/12]
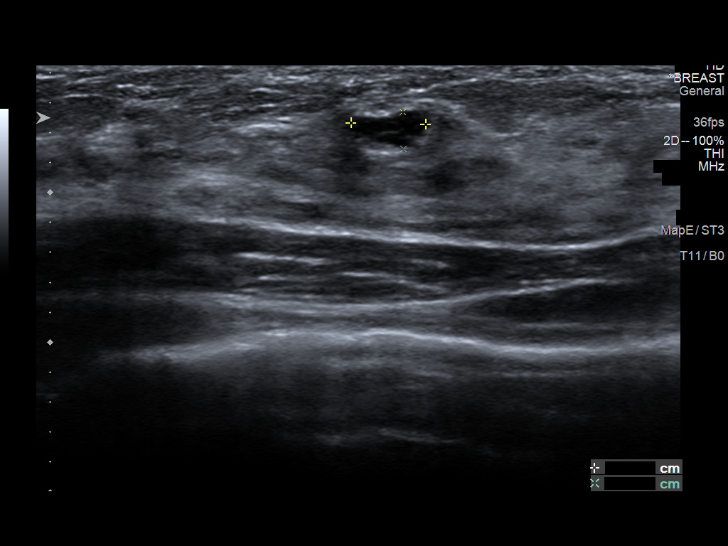

[12 of 12 positions shown; findings below may reference images not displayed]

ACR Breast Density Category d: The breast tissue is extremely dense,
which lowers the sensitivity of mammography.
FINDINGS: Mammographically unremarkable breasts with no visible masses and no
calcifications suspicious for malignancy in either breast.

Targeted ultrasound is performed, showing a 5 x 4 x 3 mm oval,
horizontally oriented cyst with thin partial internal septations and
low-level internal echoes in the 12 o'clock position of the left
breast, 3 cm from the nipple. This measured 3 x 3 x 3 mm on
12/14/2019 with no internal blood flow with power Doppler.

A by 2 x 2 mm cyst with low-level internal echoes is demonstrated in
the 2 o'clock position of the left breast, 5 cm nipple. This
measured 4 x 4 x 3 mm on 12/14/2019.

A 4 x 3 x 2 mm cyst with minimal internal echoes is demonstrated in
the 8 o'clock position of the left breast 3 cm from nipple. This
measured 4 x 3 x 2 mm on 12/14/2018.
IMPRESSION: 1. Three small, mildly complicated benign cysts in the left breast.
These do not need further follow-up.
2. No evidence of malignancy in either breast.

RECOMMENDATION:
Bilateral screening mammogram in 1 year.

I have discussed the findings and recommendations with the patient.
If applicable, a reminder letter will be sent to the patient
regarding the next appointment.

BI-RADS CATEGORY  2: Benign.

## 2023-11-15 IMAGING — MG DIGITAL DIAGNOSTIC BILAT W/ TOMO W/ CAD
8 series · 8 of 24 positions shown · non-contrast
Comparison: Previous exam(s).

CLINICAL DATA: Follow-up probably benign complicated cysts in the 2
o'clock and 12 o'clock positions of the left breast.

EXAM:
DIGITAL DIAGNOSTIC BILATERAL MAMMOGRAM WITH TOMOSYNTHESIS AND CAD;
ULTRASOUND LEFT BREAST LIMITED
TECHNIQUE: Bilateral digital diagnostic mammography and breast tomosynthesis
was performed. The images were evaluated with computer-aided
detection.; Targeted ultrasound examination of the left breast was
performed.

[R CC synth-2D]
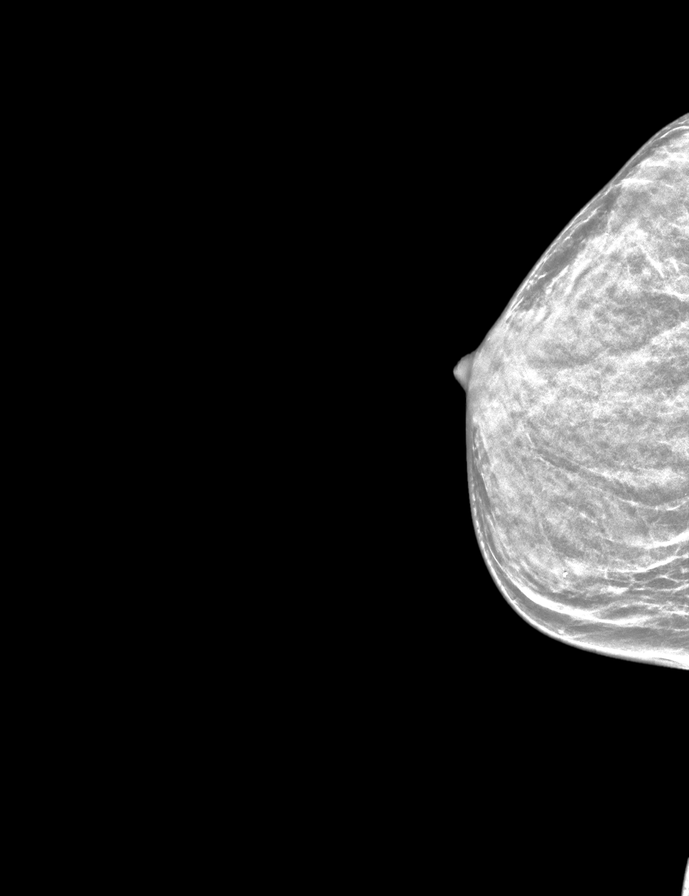

[L MLO synth-2D]
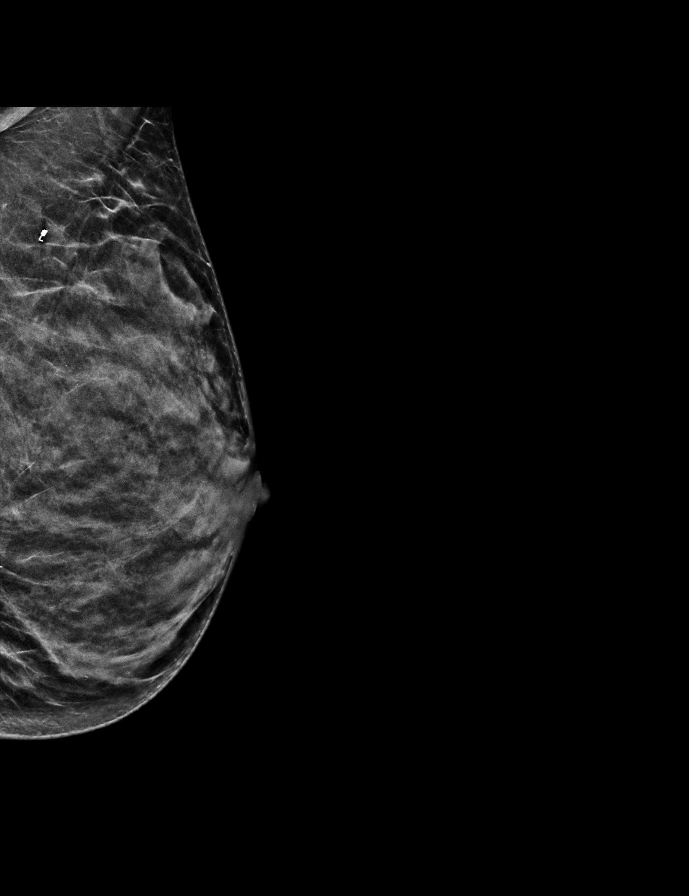

[R MLO synth-2D]
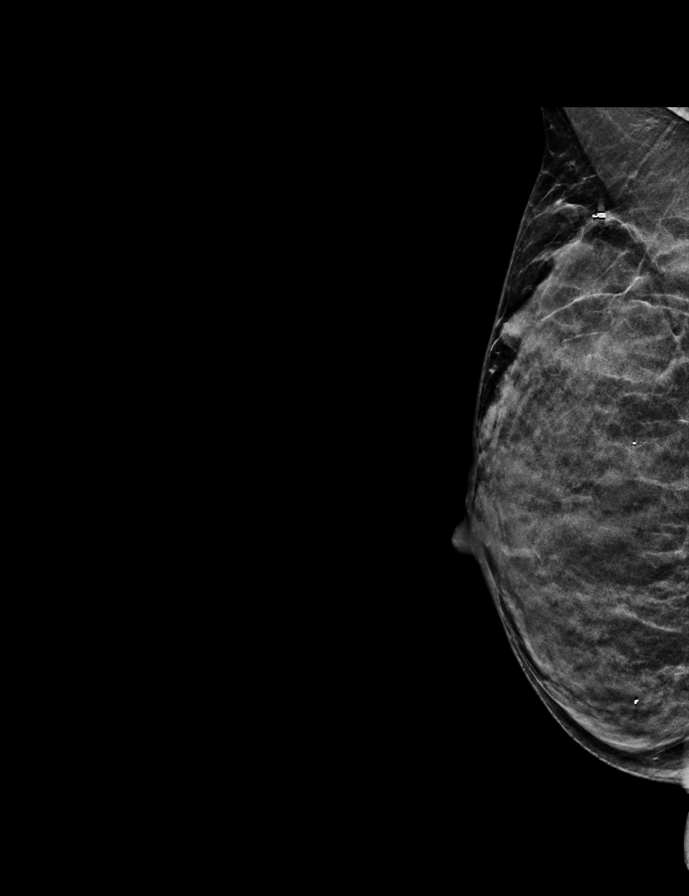

[L CC synth-2D]
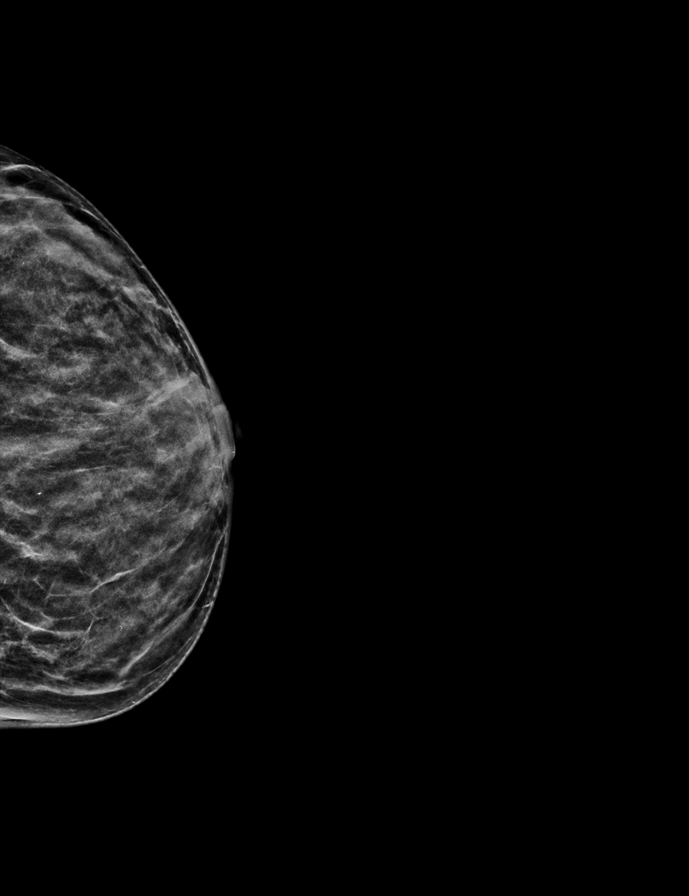

[L CC tomo · tomo slice 17/34.0]
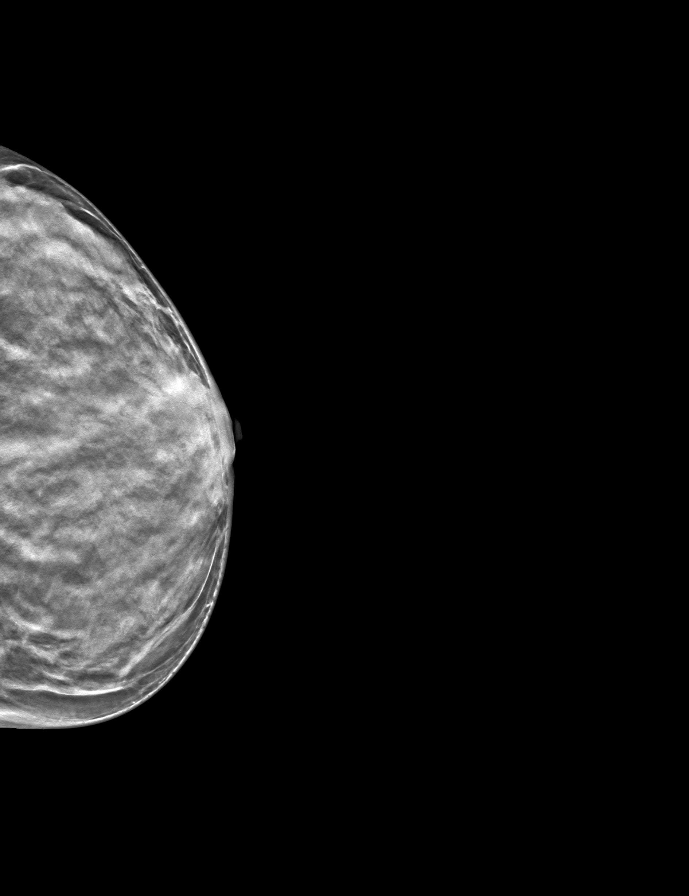

[R CC tomo · tomo slice 17/33.0]
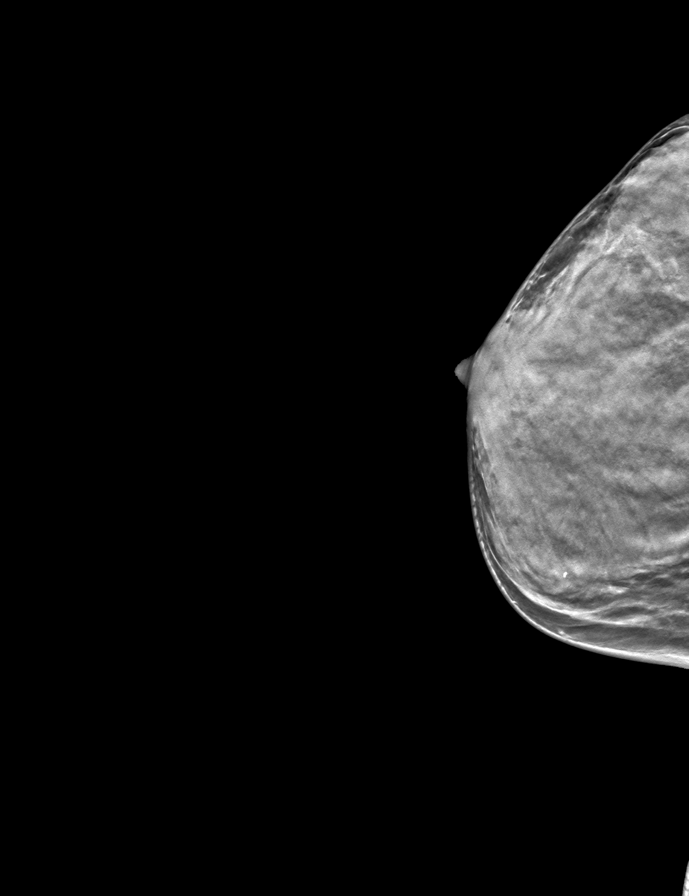

[L MLO tomo · tomo slice 19/38.0]
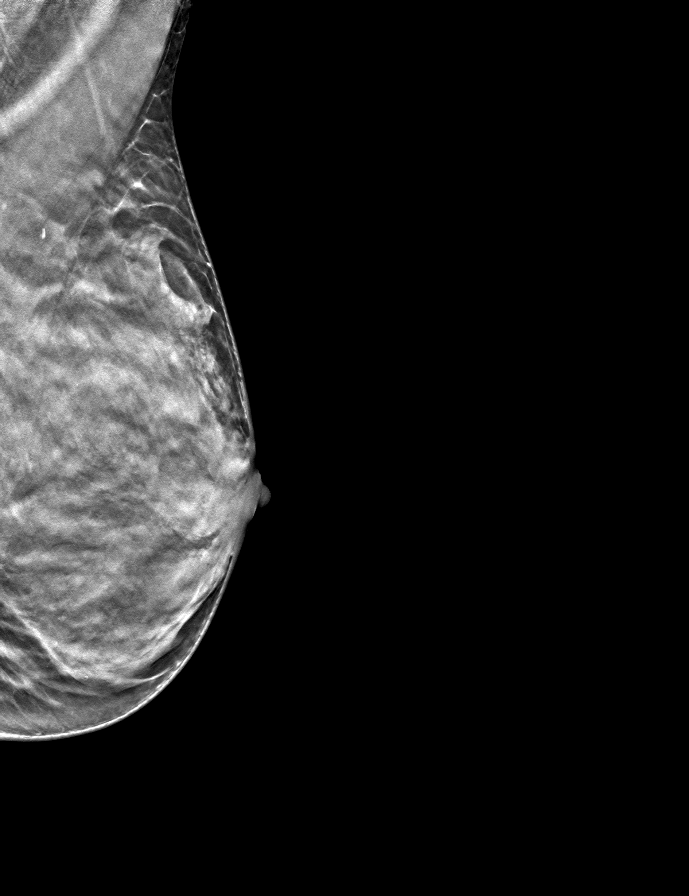

[R MLO tomo · tomo slice 17/34.0]
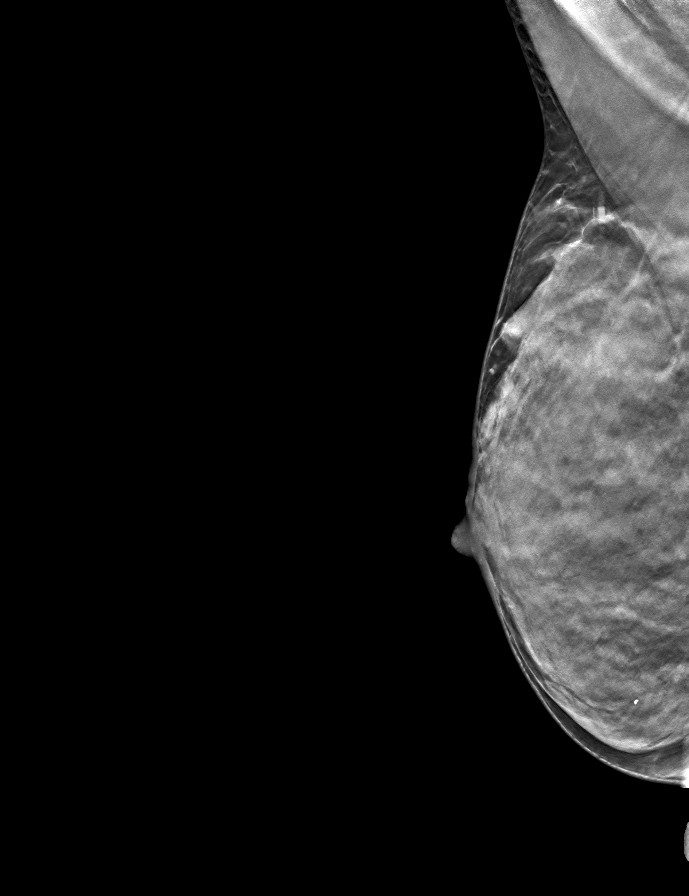

[8 of 24 positions shown; findings below may reference images not displayed]

ACR Breast Density Category d: The breast tissue is extremely dense,
which lowers the sensitivity of mammography.
FINDINGS: Mammographically unremarkable breasts with no visible masses and no
calcifications suspicious for malignancy in either breast.

Targeted ultrasound is performed, showing a 5 x 4 x 3 mm oval,
horizontally oriented cyst with thin partial internal septations and
low-level internal echoes in the 12 o'clock position of the left
breast, 3 cm from the nipple. This measured 3 x 3 x 3 mm on
12/14/2019 with no internal blood flow with power Doppler.

A by 2 x 2 mm cyst with low-level internal echoes is demonstrated in
the 2 o'clock position of the left breast, 5 cm nipple. This
measured 4 x 4 x 3 mm on 12/14/2019.

A 4 x 3 x 2 mm cyst with minimal internal echoes is demonstrated in
the 8 o'clock position of the left breast 3 cm from nipple. This
measured 4 x 3 x 2 mm on 12/14/2018.
IMPRESSION: 1. Three small, mildly complicated benign cysts in the left breast.
These do not need further follow-up.
2. No evidence of malignancy in either breast.

RECOMMENDATION:
Bilateral screening mammogram in 1 year.

I have discussed the findings and recommendations with the patient.
If applicable, a reminder letter will be sent to the patient
regarding the next appointment.

BI-RADS CATEGORY  2: Benign.

## 2023-11-27 LAB — HM MAMMOGRAPHY

## 2024-01-03 ENCOUNTER — Other Ambulatory Visit: Payer: Self-pay | Admitting: Family Medicine

## 2024-03-09 ENCOUNTER — Ambulatory Visit
Admission: EM | Admit: 2024-03-09 | Discharge: 2024-03-09 | Disposition: A | Discharge status: Home / Self Care | Attending: Family Medicine | Admitting: Family Medicine

## 2024-03-09 ENCOUNTER — Ambulatory Visit: Payer: Self-pay

## 2024-03-09 ENCOUNTER — Other Ambulatory Visit: Payer: Self-pay

## 2024-03-09 DIAGNOSIS — R220 Localized swelling, mass and lump, head: Secondary | ICD-10-CM

## 2024-03-09 MED ORDER — PREDNISONE 50 MG PO TABS: ORAL_TABLET | ORAL | 0 refills | Status: DC

## 2024-03-09 NOTE — Discharge Instructions (Addendum)
 Advised patient take medication as directed with food to completion.  Encouraged to increase daily water intake to 64 ounces per day while taking this medication.  Advised if symptoms worsen and/or unresolved please follow-up with your PCP, optometrist/ophthalmologist or here for further evaluation.

## 2024-03-09 NOTE — ED Provider Notes (Signed)
 [facility]    CSN: [id number] Arrival date & time: 03/09/24  1536      History   Chief Complaint Chief Complaint  Patient presents with   Eye Problem    HPI Joann Haynes is a 53 y.o. female.   HPI  Past Medical History:  Diagnosis Date   Bed wetting 09/12/2010   Depression    Uterine polyp     Patient Active Problem List   Diagnosis Date Noted   Menopause 02/04/2022   Anxiety 09/04/2021   Abnormal mammogram 12/08/2020   Urinary frequency 04/15/2017   Lesion of cervix 04/15/2017   Microscopic hematuria 04/15/2017   Abnormal uterine bleeding (AUB) 04/15/2017   Insomnia 06/27/2010   ANXIETY DEPRESSION 03/25/2010    Past Surgical History:  Procedure Laterality Date   COLONOSCOPY      OB History     Gravida  0   Para  0   Term  0   Preterm  0   AB  0   Living  0      SAB  0   IAB  0   Ectopic  0   Multiple  0   Live Births  0            Home Medications    Prior to Admission medications   Medication Sig Start Date End Date Taking? Authorizing Provider  predniSONE (DELTASONE) 50 MG tablet Take 1 tab p.o. daily for 5 days. 03/09/24  Yes Teddy Sharper, FNP  acetaminophen  (TYLENOL ) 325 MG tablet Take 650 mg by mouth every 6 (six) hours as needed.    [provider]  FLUoxetine  (PROZAC ) 20 MG capsule Take 1 capsule by mouth once daily 10/06/23   Alvan Dorothyann BIRCH, MD    Family History Family History  Problem Relation Age of Onset   Hypertension Mother    Breast cancer Mother 81   Colon cancer Maternal Uncle 50   Colon polyps Maternal Uncle 50   Diabetes Maternal Grandfather    Esophageal cancer Neg Hx    Rectal cancer Neg Hx    Stomach cancer Neg Hx     Social History Social History   Tobacco Use   Smoking status: Never    Passive exposure: Never   Smokeless tobacco: Never  Vaping Use   Vaping status: Never Used  Substance Use Topics   Alcohol use: Not Currently   Drug use: No      Allergies   Belsomra  [suvorexant ]   Review of Systems Review of Systems   Physical Exam Triage Vital Signs ED Triage Vitals  Encounter Vitals Group     BP 03/09/24 1549 107/66     Girls Systolic BP Percentile --      Girls Diastolic BP Percentile --      Boys Systolic BP Percentile --      Boys Diastolic BP Percentile --      Pulse Rate 03/09/24 1549 73     Resp 03/09/24 1549 16     Temp 03/09/24 1549 98.1 F (36.7 C)     Temp src --      SpO2 03/09/24 1549 97 %     Weight --      Height --      Head Circumference --      Peak Flow --      Pain Score 03/09/24 1552 6     Pain Loc --      Pain Education --  Exclude from Growth Chart --    No data found.  Updated Vital Signs BP 107/66   Pulse 73   Temp 98.1 F (36.7 C)   Resp 16   LMP 05/24/2022 (Exact Date)   SpO2 97%   Visual Acuity Right Eye Distance:   Left Eye Distance:   Bilateral Distance:    Right Eye Near:   Left Eye Near:    Bilateral Near:     Physical Exam Vitals and nursing note reviewed.  Constitutional:      Appearance: Normal appearance. She is normal weight.  HENT:     Head: Normocephalic and atraumatic.     Mouth/Throat:     Mouth: Mucous membranes are moist.     Pharynx: Oropharynx is clear.  Eyes:     Extraocular Movements: Extraocular movements intact.     Conjunctiva/sclera: Conjunctivae normal.     Pupils: Pupils are equal, round, and reactive to light.     Comments: Left inferior orbit area: Mild to moderate swelling noted  Cardiovascular:     Rate and Rhythm: Normal rate and regular rhythm.     Heart sounds: Normal heart sounds. No murmur heard. Pulmonary:     Effort: Pulmonary effort is normal.     Breath sounds: Normal breath sounds. No wheezing, rhonchi or rales.  Skin:    General: Skin is warm and dry.  Neurological:     General: No focal deficit present.     Mental Status: She is alert and oriented to person, place, and time. Mental status is at  baseline.  Psychiatric:        Mood and Affect: Mood normal.        Behavior: Behavior normal.      UC Treatments / Results  Labs (all labs ordered are listed, but only abnormal results are displayed) Labs Reviewed - No data to display  EKG   Radiology No results found.  Procedures Procedures (including critical care time)  Medications Ordered in UC Medications - No data to display  Initial Impression / Assessment and Plan / UC Course  I have reviewed the triage vital signs and the nursing notes.  Pertinent labs & imaging results that were available during my care of the patient were reviewed by me and considered in my medical decision making (see chart for details).     MDM: 1.  Left facial swelling-Rx'd prednisone 50 mg tablet: Take 1 tablet daily x 5 days. Advised patient take medication as directed with food to completion.  Encouraged to increase daily water intake to 64 ounces per day while taking this medication.  Advised if symptoms worsen and/or unresolved please follow-up with your PCP, optometrist/ophthalmologist or here for further evaluation.  Patient discharged home, hemodynamically stable. Final Clinical Impressions(s) / UC Diagnoses   Final diagnoses:  Left facial swelling     Discharge Instructions      Advised patient take medication as directed with food to completion.  Encouraged to increase daily water intake to 64 ounces per day while taking this medication.  Advised if symptoms worsen and/or unresolved please follow-up with your PCP, optometrist/ophthalmologist or here for further evaluation.     ED Prescriptions     Medication Sig Dispense Auth. Provider   predniSONE (DELTASONE) 50 MG tablet Take 1 tab p.o. daily for 5 days. 5 tablet Helayna Dun, FNP      PDMP not reviewed this encounter.   Teddy Sharper, FNP 03/09/24 [phone]

## 2024-03-09 NOTE — ED Triage Notes (Signed)
 Left eye pain since yesterday. Reports she has a blocked tear duct. No otc meds.

## 2024-03-09 NOTE — Telephone Encounter (Signed)
 Proceeding to urgent care.    Copied from CRM [id number]. Topic: Clinical - Red Word Triage >> Mar 09, 2024 11:08 AM Mercer PEDLAR wrote: Red Word that prompted transfer to Nurse Triage: Pain in left eye. Reason for Disposition  MODERATE eye pain or discomfort (e.g., interferes with normal activities or awakens from sleep; more than mild)  Answer Assessment - Initial Assessment Questions 1. ONSET: When did the pain start? (e.g., minutes, hours, days)     03/08/24 2. TIMING: Does the pain come and go, or has it been constant since it started? (e.g., constant, intermittent, fleeting)     Constant  3. SEVERITY: How bad is the pain?  (Scale 1-10; mild, moderate or severe)     5/10 4. LOCATION: Where does it hurt?  (e.g., eyelid, eye, cheekbone)     Left eye  5. CAUSE: What do you think is causing the pain?     Blocked tear duct  6. VISION: Do you have blurred vision or changes in your vision?      Denies  7. EYE DISCHARGE: Is there any discharge (pus) from the eye(s)?  If Yes, ask: What color is it?       Clear, sometimes cloudy 8. FEVER: Do you have a fever? If Yes, ask: What is it, how was it measured, and when did it start?      Denies  9. OTHER SYMPTOMS: Do you have any other symptoms? (e.g., headache, nasal discharge, facial rash)     Mild swelling lower lid, redness to corner  10. PREGNANCY: Is there any chance you are pregnant? When was your last menstrual period?  Protocols used: Eye Pain and Other Symptoms-A-AH

## 2024-03-29 ENCOUNTER — Ambulatory Visit: Admitting: Family Medicine

## 2024-04-09 ENCOUNTER — Encounter: Payer: Self-pay | Admitting: Family Medicine

## 2024-04-12 ENCOUNTER — Other Ambulatory Visit: Payer: Self-pay | Admitting: Family Medicine

## 2024-04-12 MED ORDER — FLUOXETINE HCL 20 MG PO CAPS: 20.0000 mg | ORAL_CAPSULE | Freq: Every day | ORAL | 0 refills | Status: DC

## 2024-04-12 NOTE — Telephone Encounter (Signed)
 I will accep her back. Ok to fill for 90 days and then she can schedule for end of Jan/early FEb

## 2024-04-12 NOTE — Telephone Encounter (Signed)
 Copied from CRM [id number]. Topic: Clinical - Medication Refill >> Apr 12, 2024  9:22 AM Kevelyn M wrote: Medication: FLUoxetine  (PROZAC ) 20 MG capsule  Has the patient contacted their pharmacy? No (Agent: If no, request that the patient contact the pharmacy for the refill. If patient does not wish to contact the pharmacy document the reason why and proceed with request.) (Agent: If yes, when and what did the pharmacy advise?)  This is the patient's preferred pharmacy:  [facility] [address], [place] - [address] [place]  [place] [address] Phone: [phone] Fax: [phone]  Is this the correct pharmacy for this prescription? Yes If no, delete pharmacy and type the correct one.   Has the prescription been filled recently? No  Is the patient out of the medication? No  Has the patient been seen for an appointment in the last year OR does the patient have an upcoming appointment? Yes  Can we respond through MyChart? Yes  Agent: Please be advised that Rx refills may take up to 3 business days. We ask that you follow-up with your pharmacy.

## 2024-05-02 ENCOUNTER — Ambulatory Visit: Admitting: Family Medicine

## 2024-05-23 ENCOUNTER — Encounter: Payer: Self-pay | Admitting: Family Medicine

## 2024-05-23 ENCOUNTER — Telehealth: Admitting: Family Medicine

## 2024-05-23 DIAGNOSIS — F341 Dysthymic disorder: Secondary | ICD-10-CM

## 2024-05-23 DIAGNOSIS — L989 Disorder of the skin and subcutaneous tissue, unspecified: Secondary | ICD-10-CM | POA: Diagnosis not present

## 2024-05-23 DIAGNOSIS — F5101 Primary insomnia: Secondary | ICD-10-CM

## 2024-05-23 MED ORDER — FLUOXETINE HCL 20 MG PO CAPS: 20.0000 mg | ORAL_CAPSULE | Freq: Every day | ORAL | 3 refills | Status: AC

## 2024-05-23 NOTE — Assessment & Plan Note (Signed)
 Happy with current dose of Lexapro. Doing well. Exercising.  Plan for CPE in Spring.   RF sent to pharmacy

## 2024-05-23 NOTE — Progress Notes (Signed)
 "                    MyChart Video Visit    Virtual Visit via Video Note   This format is felt to be most appropriate for this patient at this time. Physical exam was limited by quality of the video and audio technology used for the visit.    Patient location: home Provider location: [facility] Persons involved in the visit: patient, provider  I discussed the limitations of evaluation and management by telemedicine and the availability of in person appointments. The patient expressed understanding and agreed to proceed.  Patient: Joann Haynes   DOB: Dec 28, 1970   54 y.o. Female  MRN: [id number] Visit Date: 05/23/2024  Today's healthcare provider: Dorothyann Byars, MD   Chief Complaint  Patient presents with   Medication Refill    Subjective:    HPI  Discussed the use of AI scribe software for clinical note transcription with the patient, who gave verbal consent to proceed.  History of Present Illness Joann Haynes is a 54 year old female who presents for follow-up care and medication management.  Medication management and general well-being - Current medication regimen is effective. - Able to sleep well without sleep medication. - Manages sleep through regular exercise and routine. - Exercises at least three times per week and walks her dog daily when weather permits.  Nasolacrimal duct obstruction (left eye) - History of left eye blocked tear duct diagnosed a few months ago after excessive tearing. - Eye specialist confirmed blockage after numbing and attempted irrigation. - Developed left eye swelling a few months ago, treated with prednisone  at urgent care, with resolution of swelling in 3-4 days. - No further swelling since treatment, but occasional tearing persists.  Cutaneous lesions and skin concerns - Noticed changes in some moles. - Expresses concern due to fair skin.  Immunization status - Has not  received the pneumonia vaccine, which is recommended for individuals over 50. SABRA Flowsheet Row Telemedicine from 05/23/2024 in [facility] at [facility]  PHQ-9 Total Score 0     ROS       Objective:    LMP 05/24/2022       Physical Exam Vitals reviewed.  Constitutional:      Appearance: Normal appearance.  HENT:     Head: Normocephalic.  Pulmonary:     Effort: Pulmonary effort is normal.  Neurological:     Mental Status: She is alert and oriented to person, place, and time.  Psychiatric:        Mood and Affect: Mood normal.        Behavior: Behavior normal.         Assessment & Plan:    Problem List Items Addressed This Visit       Other   Insomnia   ANXIETY DEPRESSION - Primary   Happy with current dose of Lexapro. Doing well. Exercising.  Plan for CPE in Spring.   RF sent to pharmacy      Relevant Medications   FLUoxetine  (PROZAC ) 20 MG capsule   Other Relevant Orders   Ambulatory referral to Behavioral Health   Other Visit Diagnoses       Skin abnormalities       Relevant Orders   Ambulatory referral to Dermatology       Assessment and Plan Assessment & Plan Primary insomnia Improved sleep with current medication and regular  exercise. - Not on prescription medication for sleep  Obstruction of nasolacrimal duct, left side Intermittent tearing due to blocked tear duct. Previous swelling resolved with prednisone . Surgery discussed but not pursued.  Skin abnormalities Fair-skinned with increased risk for skin abnormalities. Reports mole changes. - Referred to dermatologist Darice Reusing for skin check.  General Health Maintenance Up to date on MACI vaccination. Discussed pneumonia vaccine for those over 50. - Discuss pneumonia vaccine at next physical. - Schedule routine physical and labs in the spring.    Meds ordered this encounter  Medications   FLUoxetine  (PROZAC ) 20 MG capsule    Sig:  Take 1 capsule (20 mg total) by mouth daily.    Dispense:  90 capsule    Refill:  3     No follow-ups on file.     I discussed the assessment and treatment plan with the patient. The patient was provided an opportunity to ask questions and all were answered. The patient agreed with the plan and demonstrated an understanding of the instructions.   The patient was advised to call back or seek an in-person evaluation if the symptoms worsen or if the condition fails to improve as anticipated.  I provided 20 minutes of non-face-to-face time during this encounter.  Dorothyann Byars, MD [facility] at [facility]    "
